# Patient Record
Sex: Female | Born: 1955 | Race: White | Hispanic: No | Marital: Single
Health system: Southern US, Academic
[De-identification: ages and names within clinical notes are randomized; demographics above are authoritative.]

## PROBLEM LIST (undated history)

## (undated) DIAGNOSIS — F32A Depression, unspecified: Secondary | ICD-10-CM

## (undated) DIAGNOSIS — R0600 Dyspnea, unspecified: Secondary | ICD-10-CM

## (undated) DIAGNOSIS — T8859XA Other complications of anesthesia, initial encounter: Secondary | ICD-10-CM

## (undated) DIAGNOSIS — C801 Malignant (primary) neoplasm, unspecified: Secondary | ICD-10-CM

## (undated) DIAGNOSIS — M1990 Unspecified osteoarthritis, unspecified site: Secondary | ICD-10-CM

## (undated) DIAGNOSIS — J449 Chronic obstructive pulmonary disease, unspecified: Secondary | ICD-10-CM

## (undated) DIAGNOSIS — F329 Major depressive disorder, single episode, unspecified: Secondary | ICD-10-CM

## (undated) DIAGNOSIS — T4145XA Adverse effect of unspecified anesthetic, initial encounter: Secondary | ICD-10-CM

## (undated) DIAGNOSIS — I10 Essential (primary) hypertension: Secondary | ICD-10-CM

## (undated) HISTORY — PX: HAND TENDON SURGERY: SHX663

## (undated) HISTORY — PX: DIAGNOSTIC LAPAROSCOPY: SUR761

## (undated) HISTORY — PX: TONSILLECTOMY: SUR1361

## (undated) HISTORY — PX: WRIST FRACTURE SURGERY: SHX121

## (undated) HISTORY — PX: CHOLECYSTECTOMY: SHX55

---

## 2003-07-11 HISTORY — PX: KNEE ARTHROSCOPY: SHX127

## 2008-07-10 HISTORY — PX: ABDOMINAL HYSTERECTOMY: SHX81

## 2010-07-10 HISTORY — PX: SHOULDER OPEN ROTATOR CUFF REPAIR: SHX2407

## 2014-07-10 DIAGNOSIS — C801 Malignant (primary) neoplasm, unspecified: Secondary | ICD-10-CM

## 2014-07-10 HISTORY — DX: Malignant (primary) neoplasm, unspecified: C80.1

## 2015-08-19 ENCOUNTER — Encounter
Admission: RE | Admit: 2015-08-19 | Discharge: 2015-08-19 | Disposition: A | Discharge status: Home / Self Care | Payer: 59 | Source: Ambulatory Visit | Attending: Orthopedic Surgery | Admitting: Orthopedic Surgery

## 2015-08-19 DIAGNOSIS — I10 Essential (primary) hypertension: Secondary | ICD-10-CM | POA: Diagnosis not present

## 2015-08-19 DIAGNOSIS — J449 Chronic obstructive pulmonary disease, unspecified: Secondary | ICD-10-CM | POA: Diagnosis not present

## 2015-08-19 DIAGNOSIS — Z01812 Encounter for preprocedural laboratory examination: Secondary | ICD-10-CM | POA: Diagnosis not present

## 2015-08-19 DIAGNOSIS — M1612 Unilateral primary osteoarthritis, left hip: Secondary | ICD-10-CM | POA: Insufficient documentation

## 2015-08-19 DIAGNOSIS — Z01810 Encounter for preprocedural cardiovascular examination: Secondary | ICD-10-CM | POA: Diagnosis not present

## 2015-08-19 HISTORY — DX: Essential (primary) hypertension: I10

## 2015-08-19 HISTORY — DX: Major depressive disorder, single episode, unspecified: F32.9

## 2015-08-19 HISTORY — DX: Chronic obstructive pulmonary disease, unspecified: J44.9

## 2015-08-19 HISTORY — DX: Other complications of anesthesia, initial encounter: T88.59XA

## 2015-08-19 HISTORY — DX: Unspecified osteoarthritis, unspecified site: M19.90

## 2015-08-19 HISTORY — DX: Depression, unspecified: F32.A

## 2015-08-19 HISTORY — DX: Adverse effect of unspecified anesthetic, initial encounter: T41.45XA

## 2015-08-19 HISTORY — DX: Malignant (primary) neoplasm, unspecified: C80.1

## 2015-08-19 LAB — URINALYSIS COMPLETE WITH MICROSCOPIC (ARMC ONLY)
BACTERIA UA: NONE SEEN
Bilirubin Urine: NEGATIVE
GLUCOSE, UA: NEGATIVE mg/dL
Ketones, ur: NEGATIVE mg/dL
Leukocytes, UA: NEGATIVE
NITRITE: NEGATIVE
PH: 6 (ref 5.0–8.0)
PROTEIN: NEGATIVE mg/dL
Specific Gravity, Urine: 1.011 (ref 1.005–1.030)

## 2015-08-19 LAB — BASIC METABOLIC PANEL
Anion gap: 9 (ref 5–15)
BUN: 16 mg/dL (ref 6–20)
CHLORIDE: 106 mmol/L (ref 101–111)
CO2: 27 mmol/L (ref 22–32)
Calcium: 8.9 mg/dL (ref 8.9–10.3)
Creatinine, Ser: 0.63 mg/dL (ref 0.44–1.00)
GFR calc Af Amer: >60 mL/min (ref >60)
GFR calc non Af Amer: >60 mL/min (ref >60)
GLUCOSE: 100 mg/dL — AB (ref 65–99)
POTASSIUM: 3.7 mmol/L (ref 3.5–5.1)
SODIUM: 142 mmol/L (ref 135–145)

## 2015-08-19 LAB — SEDIMENTATION RATE: SED RATE: 3 mm/h (ref 0–30)

## 2015-08-19 LAB — TYPE AND SCREEN
ABO/RH(D): A POS
Antibody Screen: NEGATIVE

## 2015-08-19 LAB — PROTIME-INR
INR: 1.07
Prothrombin Time: 14.1 seconds (ref 11.4–15.0)

## 2015-08-19 LAB — CBC
HCT: 44.7 % (ref 35.0–47.0)
HEMOGLOBIN: 14.7 g/dL (ref 12.0–16.0)
MCH: 30 pg (ref 26.0–34.0)
MCHC: 32.8 g/dL (ref 32.0–36.0)
MCV: 91.3 fL (ref 80.0–100.0)
Platelets: 231 10*3/uL (ref 150–440)
RBC: 4.9 MIL/uL (ref 3.80–5.20)
RDW: 13.1 % (ref 11.5–14.5)
WBC: 7.4 10*3/uL (ref 3.6–11.0)

## 2015-08-19 LAB — SURGICAL PCR SCREEN
MRSA, PCR: NEGATIVE
Staphylococcus aureus: NEGATIVE

## 2015-08-19 LAB — APTT: APTT: 30 s (ref 24–36)

## 2015-08-19 LAB — ABO/RH: ABO/RH(D): A POS

## 2015-08-19 NOTE — Patient Instructions (Addendum)
  Your procedure is scheduled on: Tuesday Feb. 21, 2016. Report to Same Day Surgery. To find out your arrival time please call [phone] between 1PM - 3PM on Monday Feb. 20, 2017.  Remember: Instructions that are not followed completely may result in serious medical risk, up to and including death, or upon the discretion of your surgeon and anesthesiologist your surgery may need to be rescheduled.    _x___ 1. Do not eat food or drink liquids after midnight. No gum chewing or hard candies.     ____ 2. No Alcohol for 24 hours before or after surgery.   ____ 3. Bring all medications with you on the day of surgery if instructed.    __x__ 4. Notify your doctor if there is any change in your medical condition     (cold, fever, infections).     Do not wear jewelry, make-up, hairpins, clips or nail polish.  Do not wear lotions, powders, or perfumes. You may wear deodorant.  Do not shave 48 hours prior to surgery. Men may shave face and neck.  Do not bring valuables to the hospital.    [facility] is not responsible for any belongings or valuables.               Contacts, dentures or bridgework may not be worn into surgery.  Leave your suitcase in the car. After surgery it may be brought to your room.  For patients admitted to the hospital, discharge time is determined by your treatment team.   Patients discharged the day of surgery will not be allowed to drive home.    Please read over the following fact sheets that you were given:   [facility] Preparing for Surgery  _x___ Take these medicines the morning of surgery with A SIP OF WATER: NONE     ____ Fleet Enema (as directed)   _x__ Use CHG Soap as directed  _x__ Use inhalers on the day of surgery  ____ Stop metformin 2 days prior to surgery    ____ Take 1/2 of usual insulin dose the night before surgery and none on the morning of surgery.   _x__ Stop Coumadin/Plavix/aspirin on Feb. 14, 2017 or before.  _x___ Stop  Anti-inflammatories on on Feb. 14, 2017.  OK to take Tylenol for pain.   ____ Stop supplements until after surgery.    ____ Bring C-Pap to the hospital.

## 2015-08-20 NOTE — Pre-Procedure Instructions (Signed)
Urine results sent to Dr. Rudene Christians and Anesthesia for review.

## 2015-08-21 LAB — URINE CULTURE: SPECIAL REQUESTS: NORMAL

## 2015-08-23 NOTE — Pre-Procedure Instructions (Signed)
URINE CULTURE FAXED TO DR [facility]

## 2015-08-26 NOTE — Pre-Procedure Instructions (Signed)
Spoke with Dr. Rudene Christians nurse Mendel Ryder regarding urine culture results= MULTIPLE SPECIES PRESENT, SUGGEST RECOLLECTION and request faxed to see if Dr. Rudene Christians did want to recollect urine U&C.  Dr. Rudene Christians is waiting to see if pt's insurance will agree to cover the surgery before reordering any further lab tests.

## 2015-08-27 NOTE — Pre-Procedure Instructions (Signed)
Per Dr. Rudene Christians we do not need to repeat urine culture.

## 2015-08-31 ENCOUNTER — Inpatient Hospital Stay: Payer: 59 | Admitting: Certified Registered"

## 2015-08-31 ENCOUNTER — Encounter
Admission: RE | Disposition: A | Discharge status: Skilled Nursing Facility | Payer: Self-pay | Source: Ambulatory Visit | Attending: Orthopedic Surgery

## 2015-08-31 ENCOUNTER — Inpatient Hospital Stay: Admit: 2015-08-31 | Payer: Self-pay | Admitting: Orthopedic Surgery

## 2015-08-31 ENCOUNTER — Inpatient Hospital Stay: Payer: 59

## 2015-08-31 ENCOUNTER — Inpatient Hospital Stay
Admission: RE | Admit: 2015-08-31 | Discharge: 2015-09-03 | DRG: 470 | Disposition: A | Discharge status: Skilled Nursing Facility | Payer: 59 | Source: Ambulatory Visit | Attending: Orthopedic Surgery | Admitting: Orthopedic Surgery

## 2015-08-31 DIAGNOSIS — G2581 Restless legs syndrome: Secondary | ICD-10-CM | POA: Diagnosis present

## 2015-08-31 DIAGNOSIS — Z79891 Long term (current) use of opiate analgesic: Secondary | ICD-10-CM

## 2015-08-31 DIAGNOSIS — Z79899 Other long term (current) drug therapy: Secondary | ICD-10-CM

## 2015-08-31 DIAGNOSIS — F329 Major depressive disorder, single episode, unspecified: Secondary | ICD-10-CM | POA: Diagnosis present

## 2015-08-31 DIAGNOSIS — J449 Chronic obstructive pulmonary disease, unspecified: Secondary | ICD-10-CM | POA: Diagnosis present

## 2015-08-31 DIAGNOSIS — I10 Essential (primary) hypertension: Secondary | ICD-10-CM | POA: Diagnosis present

## 2015-08-31 DIAGNOSIS — M1612 Unilateral primary osteoarthritis, left hip: Principal | ICD-10-CM | POA: Diagnosis present

## 2015-08-31 DIAGNOSIS — G8918 Other acute postprocedural pain: Secondary | ICD-10-CM

## 2015-08-31 DIAGNOSIS — Z7951 Long term (current) use of inhaled steroids: Secondary | ICD-10-CM

## 2015-08-31 DIAGNOSIS — Z7982 Long term (current) use of aspirin: Secondary | ICD-10-CM

## 2015-08-31 DIAGNOSIS — Z419 Encounter for procedure for purposes other than remedying health state, unspecified: Secondary | ICD-10-CM

## 2015-08-31 DIAGNOSIS — Z85828 Personal history of other malignant neoplasm of skin: Secondary | ICD-10-CM

## 2015-08-31 HISTORY — PX: TOTAL HIP ARTHROPLASTY: SHX124

## 2015-08-31 LAB — CREATININE, SERUM
Creatinine, Ser: 0.62 mg/dL (ref 0.44–1.00)
GFR calc non Af Amer: >60 mL/min (ref >60)

## 2015-08-31 LAB — CBC
HEMATOCRIT: 42.4 % (ref 35.0–47.0)
HEMOGLOBIN: 13.8 g/dL (ref 12.0–16.0)
MCH: 29.8 pg (ref 26.0–34.0)
MCHC: 32.6 g/dL (ref 32.0–36.0)
MCV: 91.6 fL (ref 80.0–100.0)
Platelets: 218 10*3/uL (ref 150–440)
RBC: 4.62 MIL/uL (ref 3.80–5.20)
RDW: 13.1 % (ref 11.5–14.5)
WBC: 12.4 10*3/uL — AB (ref 3.6–11.0)

## 2015-08-31 SURGERY — ARTHROPLASTY, HIP, TOTAL, ANTERIOR APPROACH
Anesthesia: Choice | Laterality: Left

## 2015-08-31 SURGERY — ARTHROPLASTY, HIP, TOTAL, ANTERIOR APPROACH
Anesthesia: General | Laterality: Left | Wound class: Clean

## 2015-08-31 MED ORDER — ONDANSETRON HCL 4 MG/2ML IJ SOLN
INTRAMUSCULAR | Status: DC | PRN
  Administered 2015-08-31: 4 mg via INTRAVENOUS

## 2015-08-31 MED ORDER — BUPIVACAINE-EPINEPHRINE 0.25% -1:200000 IJ SOLN
INTRAMUSCULAR | Status: DC | PRN
  Administered 2015-08-31: 30 mL

## 2015-08-31 MED ORDER — MIDAZOLAM HCL 2 MG/2ML IJ SOLN
INTRAMUSCULAR | Status: DC | PRN
  Administered 2015-08-31: 2 mg via INTRAVENOUS

## 2015-08-31 MED ORDER — ACETAMINOPHEN 325 MG PO TABS
650.0000 mg | ORAL_TABLET | Freq: Four times a day (QID) | ORAL | Status: DC | PRN
  Administered 2015-09-01: 650 mg via ORAL

## 2015-08-31 MED ORDER — IPRATROPIUM-ALBUTEROL 0.5-2.5 (3) MG/3ML IN SOLN
RESPIRATORY_TRACT | Status: AC
  Administered 2015-08-31: 3 mL via RESPIRATORY_TRACT
  Filled 2015-08-31: qty 3

## 2015-08-31 MED ORDER — NEOMYCIN-POLYMYXIN B GU 40-200000 IR SOLN
Status: DC | PRN
  Administered 2015-08-31: 4 mL

## 2015-08-31 MED ORDER — ENOXAPARIN SODIUM 40 MG/0.4ML ~~LOC~~ SOLN
40.0000 mg | SUBCUTANEOUS | Status: DC
  Administered 2015-09-01 – 2015-09-03 (×3): 40 mg via SUBCUTANEOUS
  Filled 2015-08-31 (×2): qty 0.4

## 2015-08-31 MED ORDER — SODIUM CHLORIDE 0.9 % IV SOLN
INTRAVENOUS | Status: DC
  Administered 2015-08-31 – 2015-09-01 (×2): via INTRAVENOUS

## 2015-08-31 MED ORDER — HYDROCHLOROTHIAZIDE 12.5 MG PO CAPS
12.5000 mg | ORAL_CAPSULE | ORAL | Status: DC
  Administered 2015-09-01 – 2015-09-03 (×3): 12.5 mg via ORAL
  Filled 2015-08-31 (×3): qty 1

## 2015-08-31 MED ORDER — ROCURONIUM BROMIDE 100 MG/10ML IV SOLN
INTRAVENOUS | Status: DC | PRN
  Administered 2015-08-31: 40 mg via INTRAVENOUS
  Administered 2015-08-31: 10 mg via INTRAVENOUS

## 2015-08-31 MED ORDER — CEFAZOLIN SODIUM-DEXTROSE 2-3 GM-% IV SOLR
2.0000 g | Freq: Four times a day (QID) | INTRAVENOUS | Status: AC
  Administered 2015-08-31 – 2015-09-01 (×3): 2 g via INTRAVENOUS
  Filled 2015-08-31 (×4): qty 50

## 2015-08-31 MED ORDER — LACTATED RINGERS IV SOLN
INTRAVENOUS | Status: DC
  Administered 2015-08-31 (×2): via INTRAVENOUS

## 2015-08-31 MED ORDER — ONDANSETRON HCL 4 MG/2ML IJ SOLN
4.0000 mg | Freq: Four times a day (QID) | INTRAMUSCULAR | Status: DC | PRN
  Administered 2015-09-01: 4 mg via INTRAVENOUS
  Filled 2015-08-31: qty 2

## 2015-08-31 MED ORDER — ROPINIROLE HCL 1 MG PO TABS
1.0000 mg | ORAL_TABLET | Freq: Three times a day (TID) | ORAL | Status: DC | PRN
  Administered 2015-08-31 – 2015-09-02 (×4): 1 mg via ORAL
  Filled 2015-08-31 (×4): qty 1

## 2015-08-31 MED ORDER — FAMOTIDINE 20 MG PO TABS
ORAL_TABLET | ORAL | Status: AC
  Administered 2015-08-31: 20 mg via ORAL
  Filled 2015-08-31: qty 1

## 2015-08-31 MED ORDER — CEFAZOLIN SODIUM-DEXTROSE 2-3 GM-% IV SOLR: 2.0000 g | Freq: Once | INTRAVENOUS | Status: DC

## 2015-08-31 MED ORDER — PHENOL 1.4 % MT LIQD
1.0000 | OROMUCOSAL | Status: DC | PRN
Start: 2015-08-31 — End: 2015-09-03
  Filled 2015-08-31: qty 177

## 2015-08-31 MED ORDER — ACETAMINOPHEN 10 MG/ML IV SOLN
INTRAVENOUS | Status: DC | PRN
  Administered 2015-08-31: 1000 mg via INTRAVENOUS

## 2015-08-31 MED ORDER — FENTANYL CITRATE (PF) 100 MCG/2ML IJ SOLN: 25.0000 ug | INTRAMUSCULAR | Status: DC | PRN

## 2015-08-31 MED ORDER — ALBUTEROL SULFATE (2.5 MG/3ML) 0.083% IN NEBU: 2.5000 mg | INHALATION_SOLUTION | RESPIRATORY_TRACT | Status: DC | PRN

## 2015-08-31 MED ORDER — HYDROMORPHONE HCL 1 MG/ML IJ SOLN
INTRAMUSCULAR | Status: AC
  Filled 2015-08-31: qty 1

## 2015-08-31 MED ORDER — ONDANSETRON HCL 4 MG PO TABS: 4.0000 mg | ORAL_TABLET | Freq: Four times a day (QID) | ORAL | Status: DC | PRN

## 2015-08-31 MED ORDER — EPHEDRINE SULFATE 50 MG/ML IJ SOLN
INTRAMUSCULAR | Status: DC | PRN
  Administered 2015-08-31: 10 mg via INTRAVENOUS
  Administered 2015-08-31: 5 mg via INTRAVENOUS
  Administered 2015-08-31: 10 mg via INTRAVENOUS

## 2015-08-31 MED ORDER — NEOMYCIN-POLYMYXIN B GU 40-200000 IR SOLN
Status: AC
  Filled 2015-08-31: qty 4

## 2015-08-31 MED ORDER — ACETAMINOPHEN 650 MG RE SUPP: 650.0000 mg | Freq: Four times a day (QID) | RECTAL | Status: DC | PRN

## 2015-08-31 MED ORDER — PHENYLEPHRINE HCL 10 MG/ML IJ SOLN
INTRAMUSCULAR | Status: DC | PRN
  Administered 2015-08-31 (×5): 100 ug via INTRAVENOUS

## 2015-08-31 MED ORDER — TRANEXAMIC ACID 1000 MG/10ML IV SOLN
1000.0000 mg | INTRAVENOUS | Status: DC
  Filled 2015-08-31: qty 10

## 2015-08-31 MED ORDER — CEFAZOLIN SODIUM-DEXTROSE 2-3 GM-% IV SOLR
INTRAVENOUS | Status: AC
  Administered 2015-08-31: 2 g via INTRAVENOUS
  Filled 2015-08-31: qty 50

## 2015-08-31 MED ORDER — ONDANSETRON HCL 4 MG/2ML IJ SOLN: 4.0000 mg | Freq: Once | INTRAMUSCULAR | Status: DC | PRN

## 2015-08-31 MED ORDER — ASPIRIN 81 MG PO CHEW
81.0000 mg | CHEWABLE_TABLET | Freq: Every day | ORAL | Status: DC
  Administered 2015-08-31 – 2015-09-02 (×3): 81 mg via ORAL
  Filled 2015-08-31 (×3): qty 1

## 2015-08-31 MED ORDER — OXYCODONE HCL 5 MG PO TABS
5.0000 mg | ORAL_TABLET | ORAL | Status: DC | PRN
  Administered 2015-08-31 – 2015-09-01 (×5): 5 mg via ORAL
  Filled 2015-08-31 (×5): qty 1

## 2015-08-31 MED ORDER — ACETAMINOPHEN 10 MG/ML IV SOLN
INTRAVENOUS | Status: AC
  Filled 2015-08-31: qty 100

## 2015-08-31 MED ORDER — MENTHOL 3 MG MT LOZG
1.0000 | LOZENGE | OROMUCOSAL | Status: DC | PRN
  Filled 2015-08-31: qty 9

## 2015-08-31 MED ORDER — ALUM & MAG HYDROXIDE-SIMETH 200-200-20 MG/5ML PO SUSP: 30.0000 mL | ORAL | Status: DC | PRN

## 2015-08-31 MED ORDER — TRANEXAMIC ACID 1000 MG/10ML IV SOLN
1000.0000 mg | INTRAVENOUS | Status: DC | PRN
  Administered 2015-08-31: 1000 mg via INTRAVENOUS

## 2015-08-31 MED ORDER — INFLUENZA VAC SPLIT QUAD 0.5 ML IM SUSY
0.5000 mL | PREFILLED_SYRINGE | INTRAMUSCULAR | Status: DC
  Filled 2015-08-31 (×2): qty 0.5

## 2015-08-31 MED ORDER — FENTANYL CITRATE (PF) 100 MCG/2ML IJ SOLN
INTRAMUSCULAR | Status: DC | PRN
  Administered 2015-08-31: 50 ug via INTRAVENOUS
  Administered 2015-08-31: 100 ug via INTRAVENOUS
  Administered 2015-08-31 (×2): 50 ug via INTRAVENOUS

## 2015-08-31 MED ORDER — FAMOTIDINE 20 MG PO TABS
20.0000 mg | ORAL_TABLET | Freq: Once | ORAL | Status: AC
  Administered 2015-08-31: 20 mg via ORAL

## 2015-08-31 MED ORDER — HYDROMORPHONE HCL 1 MG/ML IJ SOLN
INTRAMUSCULAR | Status: DC | PRN
  Administered 2015-08-31: 1 mg via INTRAVENOUS
  Administered 2015-08-31: 0.5 mg via INTRAVENOUS

## 2015-08-31 MED ORDER — ALBUTEROL SULFATE HFA 108 (90 BASE) MCG/ACT IN AERS
INHALATION_SPRAY | RESPIRATORY_TRACT | Status: DC | PRN
  Administered 2015-08-31 (×2): 2 via RESPIRATORY_TRACT

## 2015-08-31 MED ORDER — PROPOFOL 10 MG/ML IV BOLUS
INTRAVENOUS | Status: DC | PRN
  Administered 2015-08-31: 200 mg via INTRAVENOUS

## 2015-08-31 MED ORDER — METOCLOPRAMIDE HCL 5 MG PO TABS: 5.0000 mg | ORAL_TABLET | Freq: Three times a day (TID) | ORAL | Status: DC | PRN

## 2015-08-31 MED ORDER — TRANEXAMIC ACID 1000 MG/10ML IV SOLN
1000.0000 mg | Freq: Once | INTRAVENOUS | Status: AC
  Administered 2015-08-31: 1000 mg via INTRAVENOUS
  Filled 2015-08-31: qty 10

## 2015-08-31 MED ORDER — IPRATROPIUM-ALBUTEROL 0.5-2.5 (3) MG/3ML IN SOLN: 3.0000 mL | Freq: Four times a day (QID) | RESPIRATORY_TRACT | Status: DC

## 2015-08-31 MED ORDER — LIDOCAINE HCL (CARDIAC) 20 MG/ML IV SOLN
INTRAVENOUS | Status: DC | PRN
  Administered 2015-08-31: 50 mg via INTRAVENOUS

## 2015-08-31 MED ORDER — GLYCOPYRROLATE 0.2 MG/ML IJ SOLN
INTRAMUSCULAR | Status: DC | PRN
  Administered 2015-08-31: 0.2 mg via INTRAVENOUS

## 2015-08-31 MED ORDER — KETAMINE HCL 10 MG/ML IJ SOLN
INTRAMUSCULAR | Status: DC | PRN
  Administered 2015-08-31: 20 mg via INTRAVENOUS
  Administered 2015-08-31: 30 mg via INTRAVENOUS

## 2015-08-31 MED ORDER — METOCLOPRAMIDE HCL 5 MG/ML IJ SOLN: 5.0000 mg | Freq: Three times a day (TID) | INTRAMUSCULAR | Status: DC | PRN

## 2015-08-31 MED ORDER — MORPHINE SULFATE (PF) 2 MG/ML IV SOLN: 2.0000 mg | INTRAVENOUS | Status: DC | PRN

## 2015-08-31 MED ORDER — SERTRALINE HCL 100 MG PO TABS
150.0000 mg | ORAL_TABLET | Freq: Every day | ORAL | Status: DC
  Administered 2015-08-31 – 2015-09-02 (×3): 150 mg via ORAL
  Filled 2015-08-31 (×3): qty 2

## 2015-08-31 MED ORDER — IPRATROPIUM-ALBUTEROL 0.5-2.5 (3) MG/3ML IN SOLN
3.0000 mL | Freq: Once | RESPIRATORY_TRACT | Status: AC
  Administered 2015-08-31: 3 mL via RESPIRATORY_TRACT

## 2015-08-31 MED ORDER — BUPIVACAINE-EPINEPHRINE (PF) 0.25% -1:200000 IJ SOLN
INTRAMUSCULAR | Status: AC
  Filled 2015-08-31: qty 30

## 2015-08-31 MED ORDER — TIOTROPIUM BROMIDE MONOHYDRATE 18 MCG IN CAPS
18.0000 ug | ORAL_CAPSULE | Freq: Every day | RESPIRATORY_TRACT | Status: DC
Start: 2015-08-31 — End: 2015-09-03
  Administered 2015-09-01 – 2015-09-03 (×3): 18 ug via RESPIRATORY_TRACT
  Filled 2015-08-31: qty 5

## 2015-08-31 SURGICAL SUPPLY — 46 items
BLADE SAW SAG 18.5X105 (BLADE) ×3 IMPLANT
BNDG COHESIVE 6X5 TAN STRL LF (GAUZE/BANDAGES/DRESSINGS) ×6 IMPLANT
CANISTER SUCT 1200ML W/VALVE (MISCELLANEOUS) ×3 IMPLANT
CAPT HIP TOTAL 3 ×3 IMPLANT
CATH FOL LEG HOLDER (MISCELLANEOUS) ×3 IMPLANT
CATH TRAY METER 16FR LF (MISCELLANEOUS) ×3 IMPLANT
CHLORAPREP W/TINT 26ML (MISCELLANEOUS) ×3 IMPLANT
DRAPE C-ARM XRAY 36X54 (DRAPES) ×3 IMPLANT
DRAPE C-SECTION (MISCELLANEOUS) IMPLANT
DRAPE INCISE IOBAN 66X60 STRL (DRAPES) IMPLANT
DRAPE POUCH INSTRU U-SHP 10X18 (DRAPES) ×3 IMPLANT
DRAPE SHEET LG 3/4 BI-LAMINATE (DRAPES) ×9 IMPLANT
DRAPE STERI IOBAN 125X83 (DRAPES) ×3 IMPLANT
DRAPE TABLE BACK 80X90 (DRAPES) ×3 IMPLANT
DRSG OPSITE POSTOP 4X8 (GAUZE/BANDAGES/DRESSINGS) ×6 IMPLANT
ELECT BLADE 6.5 EXT (BLADE) ×3 IMPLANT
GAUZE SPONGE 4X4 12PLY STRL (GAUZE/BANDAGES/DRESSINGS) IMPLANT
GLOVE BIOGEL PI IND STRL 9 (GLOVE) ×1 IMPLANT
GLOVE BIOGEL PI INDICATOR 9 (GLOVE) ×2
GLOVE SURG ORTHO 9.0 STRL STRW (GLOVE) ×3 IMPLANT
GOWN SPECIALTY ULTRA XL (MISCELLANEOUS) ×3 IMPLANT
GOWN STRL REUS W/ TWL LRG LVL3 (GOWN DISPOSABLE) ×1 IMPLANT
GOWN STRL REUS W/TWL LRG LVL3 (GOWN DISPOSABLE) ×2
HEMOVAC 400CC 10FR (MISCELLANEOUS) IMPLANT
HOOD PEEL AWAY FLYTE STAYCOOL (MISCELLANEOUS) ×6 IMPLANT
MAT BLUE FLOOR 46X72 FLO (MISCELLANEOUS) ×3 IMPLANT
NDL SAFETY 18GX1.5 (NEEDLE) IMPLANT
NEEDLE SPNL 18GX3.5 QUINCKE PK (NEEDLE) ×3 IMPLANT
NS IRRIG 1000ML POUR BTL (IV SOLUTION) ×3 IMPLANT
PACK HIP COMPR (MISCELLANEOUS) ×3 IMPLANT
SOL PREP PVP 2OZ (MISCELLANEOUS) ×3
SOLUTION PREP PVP 2OZ (MISCELLANEOUS) ×1 IMPLANT
STAPLER SKIN PROX 35W (STAPLE) ×3 IMPLANT
STRAP SAFETY BODY (MISCELLANEOUS) ×3 IMPLANT
SUT DVC 2 QUILL PDO  T11 36X36 (SUTURE) ×2
SUT DVC 2 QUILL PDO T11 36X36 (SUTURE) ×1 IMPLANT
SUT DVC QUILL MONODERM 30X30 (SUTURE) ×3 IMPLANT
SUT ETHIBOND NAB CT1 #1 30IN (SUTURE) ×3 IMPLANT
SUT SILK 0 (SUTURE) ×2
SUT SILK 0 30XBRD TIE 6 (SUTURE) ×1 IMPLANT
SUT VIC AB 1 CT1 36 (SUTURE) ×3 IMPLANT
SYR 20CC LL (SYRINGE) IMPLANT
SYR 30ML LL (SYRINGE) ×3 IMPLANT
TAPE MICROFOAM 4IN (TAPE) IMPLANT
TUBE KAMVAC SUCTION (TUBING) ×3 IMPLANT
WATER STERILE IRR 1000ML POUR (IV SOLUTION) ×3 IMPLANT

## 2015-08-31 NOTE — NC FL2 (Signed)
[place] LEVEL OF CARE SCREENING TOOL     IDENTIFICATION  Patient Name: Melanie Reilly Birthdate: Mar 30, 1956 Sex: female Admission Date (Current Location): 08/31/2015  [place] and [place] Number:  [employer] and Address:  [facility], [address] [place], [place] [address]      Provider Number: [id number]  Attending Physician Name and Address:  Hessie Knows, MD  Relative Name and Phone Number:       Current Level of Care: Hospital Recommended Level of Care: [place] Prior Approval Number:    Date Approved/Denied:   PASRR Number:  ([id number])  Discharge Plan: SNF    Current Diagnoses: Patient Active Problem List   Diagnosis Date Noted  . Primary osteoarthritis of left hip 08/31/2015   COPD (chronic obstructive pulmonary disease) , unspecified (CMS-HCC)    Skin cancer    Hypertension    Depression, unspecified    Restless legs       Orientation RESPIRATION BLADDER Height & Weight     Self, Time, Situation, Place  O2 (2.5 Liters Oxygen ) Continent Weight: 217 lb 4.8 oz (98.567 kg) Height:  5\' 10"  (177.8 cm)  BEHAVIORAL SYMPTOMS/MOOD NEUROLOGICAL BOWEL NUTRITION STATUS   (none )  (none ) Continent Diet (Diet: Clear Liquid )  AMBULATORY STATUS COMMUNICATION OF NEEDS Skin   Extensive Assist Verbally Surgical wounds (Incision: Left Hip. )                       Personal Care Assistance Level of Assistance  Bathing, Feeding, Dressing Bathing Assistance: Limited assistance Feeding assistance: Independent Dressing Assistance: Limited assistance     Functional Limitations Info  Sight, Hearing, Speech Sight Info: Adequate Hearing Info: Adequate Speech Info: Adequate    SPECIAL CARE FACTORS FREQUENCY  PT (By licensed PT), OT (By licensed OT)     PT Frequency:  (5) OT Frequency:  (5)            Contractures      Additional Factors Info  Code Status Code Status Info:   (Full Code. )             Current Medications (08/31/2015):  This is the current hospital active medication list Current Facility-Administered Medications  Medication Dose Route Frequency Provider Last Rate Last Dose  . 0.9 %  sodium chloride infusion   Intravenous Continuous Hessie Knows, MD 75 mL/hr at 08/31/15 1507    . acetaminophen (TYLENOL) tablet 650 mg  650 mg Oral Q6H PRN Hessie Knows, MD       Or  . acetaminophen (TYLENOL) suppository 650 mg  650 mg Rectal Q6H PRN Hessie Knows, MD      . albuterol (PROVENTIL) (2.5 MG/3ML) 0.083% nebulizer solution 2.5 mg  2.5 mg Inhalation Q4H PRN Hessie Knows, MD      . alum & mag hydroxide-simeth (MAALOX/MYLANTA) 200-200-20 MG/5ML suspension 30 mL  30 mL Oral Q4H PRN Hessie Knows, MD      . aspirin chewable tablet 81 mg  81 mg Oral QHS Hessie Knows, MD      . ceFAZolin (ANCEF) IVPB 2 g/50 mL premix  2 g Intravenous Q6H Hessie Knows, MD      . Derrill Memo ON 09/01/2015] enoxaparin (LOVENOX) injection 40 mg  40 mg Subcutaneous Q24H Hessie Knows, MD      . Derrill Memo ON 09/01/2015] hydrochlorothiazide (MICROZIDE) capsule 12.5 mg  12.5 mg Oral Tedra Senegal, MD      . [  START ON 09/01/2015] Influenza vac split quadrivalent PF (FLUARIX) injection 0.5 mL  0.5 mL Intramuscular Tomorrow-1000 Hessie Knows, MD      . menthol-cetylpyridinium (CEPACOL) lozenge 3 mg  1 lozenge Oral PRN Hessie Knows, MD       Or  . phenol (CHLORASEPTIC) mouth spray 1 spray  1 spray Mouth/Throat PRN Hessie Knows, MD      . metoCLOPramide (REGLAN) tablet 5-10 mg  5-10 mg Oral Q8H PRN Hessie Knows, MD       Or  . metoCLOPramide (REGLAN) injection 5-10 mg  5-10 mg Intravenous Q8H PRN Hessie Knows, MD      . morphine 2 MG/ML injection 2 mg  2 mg Intravenous Q1H PRN Hessie Knows, MD      . ondansetron [facility]) tablet 4 mg  4 mg Oral Q6H PRN Hessie Knows, MD       Or  . ondansetron [facility]) injection 4 mg  4 mg Intravenous Q6H PRN Hessie Knows, MD      . oxyCODONE (Oxy IR/ROXICODONE)  immediate release tablet 5-10 mg  5-10 mg Oral Q3H PRN Hessie Knows, MD      . rOPINIRole (REQUIP) tablet 1 mg  1 mg Oral TID PRN Hessie Knows, MD      . sertraline (ZOLOFT) tablet 150 mg  150 mg Oral QHS Hessie Knows, MD      . tiotropium [facility]) inhalation capsule 18 mcg  18 mcg Inhalation Daily Hessie Knows, MD   18 mcg at 08/31/15 1523     Discharge Medications: Please see discharge summary for a list of discharge medications.  Relevant Imaging Results:  Relevant Lab Results:   Additional Information  (SSN: [id number])  Loralyn Freshwater, LCSW

## 2015-08-31 NOTE — OR Nursing (Signed)
Sacral pad sent to OR 

## 2015-08-31 NOTE — Care Management Note (Addendum)
Case Management Note  Patient Details  Name: Tayllor Kindle MRN: [id number] Date of Birth: Jul 04, 1956  Subjective/Objective:     Mrs Muffley will be going home with her sister after this hospital discharge to the following address: [address], [place], [place],  Ph: [phone],  Sister Gavin Pound.   59yo Mrs Castella Postlethwaite received a left THA on 08/31/15 by Dr Rudene Christians. Hx: COPD. PCP=DR John Doughton. Pharmacy=[facility] in [place], [place]. Lives in an appartment with 2 friends in [place], but will be staying with her sister after surgery at the address in [place] listed above. Has no home assistive equipment. A rolling walker was delivered to her hospital room today by [facility]. Daughter refused offer of a BSC. No home oxygen and no current home health services. Daughter will provide transportation to appointments. Lovenox 40mg  SQ daily x 14 days called to [facility] in [place], [place].ph: [phone].  Case management will follow for discharge planning.             Action/Plan:   Expected Discharge Date:                  Expected Discharge Plan:     In-House Referral:     Discharge planning Services     Post Acute Care Choice:    Choice offered to:     DME Arranged:    DME Agency:     HH Arranged:    [place] Agency:     Status of Service:     Medicare Important Message Given:    Date Medicare IM Given:    Medicare IM give by:    Date Additional Medicare IM Given:    Additional Medicare Important Message give by:     If discussed at [place] of Stay Meetings, dates discussed:    Additional Comments:  Laila Myhre A, RN 08/31/2015, 3:16 PM

## 2015-08-31 NOTE — Op Note (Signed)
08/31/2015  1:01 PM  PATIENT:  Melanie Reilly  60 y.o. female  PRE-OPERATIVE DIAGNOSIS: Primary hip osteoarthritis  POST-OPERATIVE DIAGNOSIS:  Primary hip osteoarthritis left  PROCEDURE:  Procedure(s): TOTAL HIP ARTHROPLASTY ANTERIOR APPROACH (Left)  SURGEON: Laurene Footman, MD  ASSISTANTS: None  ANESTHESIA:   general  EBL:  Total I/O In: 1000 [I.V.:1000] Out: 350 [Urine:150; Blood:200]  BLOOD ADMINISTERED:none  DRAINS: none   LOCAL MEDICATIONS USED:  BUPIVICAINE   SPECIMEN:  Source of Specimen:  Left femoral head  DISPOSITION OF SPECIMEN:  PATHOLOGY  COUNTS:  YES  TOURNIQUET:  * No tourniquets in log *  IMPLANTS: Medacta AMIS 3 standard stem, 52 mm Mpact cup DM with M 28 mm head and liner  DICTATION: .Dragon Dictation   The patient was brought to the operating room and after general anesthesia was obtained patient was placed on the operative table with the ipsilateral foot into the Medacta attachment, contralateral leg on a well-padded table. C-arm was brought in and preop template x-ray taken. After prepping and draping in usual sterile fashion appropriate patient identification and timeout procedures were completed. Anterior approach to the hip was obtained and centered over the greater trochanter and TFL muscle. The subcutaneous tissue was incised hemostasis being achieved by electrocautery. TFL fascia was incised and the muscle retracted laterally deep retractor placed. The lateral femoral circumflex vessels were identified and ligated. The anterior capsule was exposed and a capsulotomy performed. The neck was identified and a femoral neck cut carried out with a saw. The head was removed without difficulty and showed sclerotic femoral head and acetabulum. Reaming was carried out to 52 mm and a 52 mm cup trial gave appropriate tightness to the acetabular component a 77 Mpact DM cup was impacted into position. The leg was then externally rotated and ischiofemoral and  patellofemoral releases carried out. The femur was sequentially broached to a size 3, size 3 standard stem and S head trials were placed, followed by M head which gave better leg length and offset and the final components chosen. The 3 standard collared stem was inserted along with a M 28 mm head and 52 mm liner. The hip was reduced and was stable the wound was thoroughly irrigated with a dilute Betadine solution. The deep fascia view. Using a heavy Quill after infiltration of 30 cc of quarter percent Sensorcaine with epinephrine. 2-0 Quill to close the skin with skin staples Xeroform and honeycomb dressing applied  PLAN OF CARE: Admit to inpatient

## 2015-08-31 NOTE — Addendum Note (Signed)
Addendum  created 08/31/15 1336 by Rolla Plate, CRNA   Modules edited: Charges VN

## 2015-08-31 NOTE — Transfer of Care (Signed)
Immediate Anesthesia Transfer of Care Note  Patient: Melanie Reilly  Procedure(s) Performed: Procedure(s): TOTAL HIP ARTHROPLASTY ANTERIOR APPROACH (Left)  Patient Location: PACU  Anesthesia Type:General  Level of Consciousness: awake  Airway & Oxygen Therapy: Patient Spontanous Breathing and Patient connected to face mask oxygen  Post-op Assessment: Report given to RN  Post vital signs: Reviewed  Last Vitals:  Filed Vitals:   08/31/15 1009 08/31/15 1300  BP: 181/78 96/50  Pulse: 68 76  Temp: 36.4 C 37.3 C  Resp: 16 13    Complications: No apparent anesthesia complications

## 2015-08-31 NOTE — Anesthesia Procedure Notes (Signed)
Procedure Name: Intubation Performed by: Rolla Plate Pre-anesthesia Checklist: Patient identified, Patient being monitored, Timeout performed, Emergency Drugs available and Suction available Patient Re-evaluated:Patient Re-evaluated prior to inductionOxygen Delivery Method: Circle system utilized Preoxygenation: Pre-oxygenation with 100% oxygen Intubation Type: IV induction Ventilation: Mask ventilation without difficulty Laryngoscope Size: Miller and 2 Grade View: Grade I Tube type: Oral Tube size: 7.0 mm Number of attempts: 1 Placement Confirmation: ETT inserted through vocal cords under direct vision,  positive ETCO2 and breath sounds checked- equal and bilateral Secured at: 21 cm Tube secured with: Tape Dental Injury: Teeth and Oropharynx as per pre-operative assessment

## 2015-08-31 NOTE — H&P (Signed)
Reviewed paper H+P, will be scanned into chart. No changes noted.  

## 2015-08-31 NOTE — Anesthesia Preprocedure Evaluation (Signed)
Anesthesia Evaluation  Patient identified by MRN, date of birth, ID band Patient awake    Reviewed: Allergy & Precautions, H&P , NPO status , Patient's Chart, lab work & pertinent test results, reviewed documented beta blocker date and time   History of Anesthesia Complications (+) history of anesthetic complications  Airway Mallampati: II   Neck ROM: full    Dental  (+) Poor Dentition   Pulmonary neg pulmonary ROS, COPD,  COPD inhaler, Current Smoker,    Pulmonary exam normal        Cardiovascular hypertension, negative cardio ROS Normal cardiovascular exam     Neuro/Psych negative neurological ROS  negative psych ROS   GI/Hepatic negative GI ROS, Neg liver ROS,   Endo/Other  negative endocrine ROS  Renal/GU negative Renal ROS  negative genitourinary   Musculoskeletal   Abdominal   Peds  Hematology negative hematology ROS (+)   Anesthesia Other Findings Past Medical History:   Complication of anesthesia                                     Comment:my COPD, I had difficulty breathing when I woke              up.   COPD (chronic obstructive pulmonary disease) (*              Hypertension                                                 Depression                                                   Arthritis                                                    Cancer ([place])                                    2016           Comment:skin cancer on forehead Past Surgical History:   CHOLECYSTECTOMY                                               SHOULDER OPEN ROTATOR CUFF REPAIR               Left 2012         KNEE ARTHROSCOPY                                Left 2005         ABDOMINAL HYSTERECTOMY  2010         TONSILLECTOMY                                                 DIAGNOSTIC LAPAROSCOPY                                      BMI    Body Mass Index   28.83 kg/m 2     Reproductive/Obstetrics                             Anesthesia Physical Anesthesia Plan  ASA: III  Anesthesia Plan: General   Post-op Pain Management:    Induction:   Airway Management Planned:   Additional Equipment:   Intra-op Plan:   Post-operative Plan:   Informed Consent: I have reviewed the patients History and Physical, chart, labs and discussed the procedure including the risks, benefits and alternatives for the proposed anesthesia with the patient or authorized representative who has indicated his/her understanding and acceptance.   Dental Advisory Given  Plan Discussed with: CRNA  Anesthesia Plan Comments:         Anesthesia Quick Evaluation

## 2015-08-31 NOTE — Anesthesia Postprocedure Evaluation (Signed)
Anesthesia Post Note  Patient: Melanie Reilly  Procedure(s) Performed: Procedure(s) (LRB): TOTAL HIP ARTHROPLASTY ANTERIOR APPROACH (Left)  Patient location during evaluation: PACU Anesthesia Type: General Level of consciousness: awake and alert Pain management: pain level controlled Vital Signs Assessment: post-procedure vital signs reviewed and stable Respiratory status: spontaneous breathing, nonlabored ventilation, respiratory function stable and patient connected to nasal cannula oxygen Cardiovascular status: blood pressure returned to baseline and stable Postop Assessment: no signs of nausea or vomiting Anesthetic complications: no    Last Vitals:  Filed Vitals:   08/31/15 1300 08/31/15 1315  BP: 96/50 109/58  Pulse: 76 80  Temp: 37.3 C   Resp: 13 12    Last Pain:  Filed Vitals:   08/31/15 1317  PainSc: [place] Carletta Feasel

## 2015-08-31 NOTE — Progress Notes (Signed)
PT Cancellation Note  Patient Details Name: Melanie Reilly MRN: [id number] DOB: [birth year]   Cancelled Treatment:    Reason Eval/Treat Not Completed: Other (comment). Pt very lethargic upon arrival. Unable to keep eyes open. Pt not able to participate in supine there-ex or mobility at this time secondary to lethargy. Son in room and in agreement that therapy will resume next date.   Norvin Ohlin 08/31/2015, 3:30 PM  Greggory Stallion, PT, DPT [phone]

## 2015-09-01 MED ORDER — BISACODYL 5 MG PO TBEC
5.0000 mg | DELAYED_RELEASE_TABLET | Freq: Every day | ORAL | Status: DC | PRN
  Administered 2015-09-02: 5 mg via ORAL
  Filled 2015-09-01: qty 1

## 2015-09-01 MED ORDER — DOCUSATE SODIUM 100 MG PO CAPS
100.0000 mg | ORAL_CAPSULE | Freq: Two times a day (BID) | ORAL | Status: DC
  Administered 2015-09-01 – 2015-09-03 (×4): 100 mg via ORAL
  Filled 2015-09-01 (×4): qty 1

## 2015-09-01 MED ORDER — NICOTINE 14 MG/24HR TD PT24
14.0000 mg | MEDICATED_PATCH | Freq: Every day | TRANSDERMAL | Status: DC
  Administered 2015-09-01 – 2015-09-03 (×3): 14 mg via TRANSDERMAL
  Filled 2015-09-01 (×3): qty 1

## 2015-09-01 MED ORDER — MAGNESIUM HYDROXIDE 400 MG/5ML PO SUSP
30.0000 mL | Freq: Every day | ORAL | Status: DC | PRN
  Administered 2015-09-01: 30 mL via ORAL
  Filled 2015-09-01: qty 30

## 2015-09-01 MED ORDER — HYDROCODONE-ACETAMINOPHEN 5-325 MG PO TABS
1.0000 | ORAL_TABLET | ORAL | Status: DC | PRN
  Administered 2015-09-01 – 2015-09-03 (×6): 1 via ORAL
  Filled 2015-09-01 (×6): qty 1

## 2015-09-01 NOTE — Care Management Note (Signed)
Case Management Note  Patient Details  Name: Melanie Reilly MRN: [id number] Date of Birth: [birth year]  Subjective/Objective:      [name] reports that she is uninsured and is paying Dr Rudene Christians on installments. She reports that she is unable to pay $150 for Lovenox and Dr Rudene Christians was updated. [facility]-PT is recommending SNF. Blima Rich, CSW, notified Dr Rudene Christians and [name] that she will not be eligible for rehab. This Probation officer notified [place] that apparently [name] is uninsured. [place] from [place] is checking to see if they can provide some charity PT visits after discharge. Case management will follow for discharge planning.                Action/Plan:   Expected Discharge Date:                  Expected Discharge Plan:     In-House Referral:     Discharge planning Services     Post Acute Care Choice:    Choice offered to:     DME Arranged:    DME Agency:     HH Arranged:    [place] Agency:     Status of Service:     Medicare Important Message Given:    Date Medicare IM Given:    Medicare IM give by:    Date Additional Medicare IM Given:    Additional Medicare Important Message give by:     If discussed at [place] of Stay Meetings, dates discussed:    Additional Comments:  Roxine Whittinghill A, RN 09/01/2015, 10:37 AM

## 2015-09-01 NOTE — Progress Notes (Signed)
Gerald Stabs, [place] here to see patient.

## 2015-09-01 NOTE — Progress Notes (Signed)
OT Cancellation Note  Patient Details Name: Melanie Reilly MRN: [id number] DOB: 1955-08-06   Cancelled Treatment:    Reason Eval/Treat Not Completed: Other (comment). Second attempt for Occupational Therapy evaluation. Patient being placed on bedside commode.  Sharon Mt 09/01/2015, 3:44 PM

## 2015-09-01 NOTE — Progress Notes (Signed)
OT Cancellation Note  Patient Details Name: Melanie Reilly MRN: [id number] DOB: 08/10/1955   Cancelled Treatment:    Reason Eval/Treat Not Completed: Other (comment). Patient with Physical Therapy. Will re-attempt time permitting.   Sharon Mt 09/01/2015, 1:21 PM

## 2015-09-01 NOTE — Progress Notes (Signed)
Physical Therapy Treatment Patient Details Name: Melanie Reilly MRN: [id number] DOB: January 30, 1956 Today's Date: 09/01/2015    History of Present Illness Pt is s/p L THA.     PT Comments    Pt is making limited progress towards goals with increased ambulation distance this session. All mobility performed on room air with sats decreasing to 88%. 2L of O2 donned at end of session with sats improving. Pt with limited WB on L LE despite cues. Still needs +2 assist for all mobility and limited by pain. No complaints of dizziness this session.  Follow Up Recommendations  SNF     Equipment Recommendations       Recommendations for Other Services       Precautions / Restrictions Precautions Precautions: Anterior Hip;Fall Precaution Booklet Issued: Yes (comment) Restrictions Weight Bearing Restrictions: Yes LLE Weight Bearing: Weight bearing as tolerated    Mobility  Bed Mobility Overal bed mobility: Needs Assistance Bed Mobility: Supine to Sit     Supine to sit: Max assist     General bed mobility comments: assist for bed mobility. Once seated at EOB, pt able to sit with cga.   Transfers Overall transfer level: Needs assistance Equipment used: Rolling walker (2 wheeled) Transfers: Sit to/from Stand Sit to Stand: Mod assist;+2 physical assistance         General transfer comment: assist for standing. Cued for ant. translation prior to standing. Once standing, pt with limited weight bearing on L LE. Pt required cues for weight bearing.  Ambulation/Gait Ambulation/Gait assistance: Min assist;+2 physical assistance Ambulation Distance (Feet): 5 Feet Assistive device: Rolling walker (2 wheeled) Gait Pattern/deviations: Step-to pattern     General Gait Details: ambulated with step to gait pattern with rw. Pt with limited weight bearing on L LE. Heavy cues for sequencing given and pt requires +2 assistance. Antalgic gait pattern performed.   Stairs             Wheelchair Mobility    Modified Rankin (Stroke Patients Only)       Balance                                    Cognition Arousal/Alertness: Awake/alert Behavior During Therapy: Anxious Overall Cognitive Status: Within Functional Limits for tasks assessed                      Exercises Other Exercises Other Exercises: Supine ther-ex performed including L LE ankle pumps, quad sets, hip abd/add, SLRs, and glut sets. All ther-ex performed x 10 reps with mod assist for assistance.    General Comments        Pertinent Vitals/Pain Pain Assessment: 0-10 Pain Score: 6  Pain Location: L hip Pain Descriptors / Indicators: Operative site guarding Pain Intervention(s): Limited activity within patient's tolerance    Home Living                      Prior Function            PT Goals (current goals can now be found in the care plan section) Acute Rehab PT Goals Patient Stated Goal: to go home with sister PT Goal Formulation: With patient Time For Goal Achievement: 09/15/15 Potential to Achieve Goals: Good Progress towards PT goals: Progressing toward goals    Frequency  BID    PT Plan Current plan remains appropriate    Co-evaluation  End of Session Equipment Utilized During Treatment: Gait belt;Oxygen Activity Tolerance: Patient limited by pain;Patient limited by fatigue;Treatment limited secondary to medical complications (Comment) Patient left: in chair;with chair alarm set     Time: [phone] PT Time Calculation (min) (ACUTE ONLY): 25 min  Charges:  $Gait Training: 8-22 mins $Therapeutic Exercise: 8-22 mins                    G Codes:      Easton Sivertson 23-Sep-2015, 3:13 PM  Greggory Stallion, PT, DPT [phone]

## 2015-09-01 NOTE — Clinical Social Work Note (Addendum)
Clinical Social Work Assessment  Patient Details  Name: Melanie Reilly MRN: [id number] Date of Birth: [birth year]  Date of referral:  09/01/15               Reason for consult:  Facility Placement, Financial Concerns                Permission sought to share information with:  Family Supports Permission granted to share information::  Yes, Verbal Permission Granted  Name::      [place]::     Relationship::   Sister   Contact Information:   [phone]  Housing/Transportation Living arrangements for the past 2 months:  Single Family Home Source of Information:  Patient, Other (Comment Required) (Sister ) Patient Interpreter Needed:  None Criminal Activity/Legal Involvement Pertinent to Current Situation/Hospitalization:  No - Comment as needed Significant Relationships:  Adult Children, Siblings Lives with:  Roommate Do you feel safe going back to the place where you live?  Yes Need for family participation in patient care:  Yes (Comment)  Care giving concerns:  Patient lives alone in [place].    Social Worker assessment / plan:  Holiday representative ([place]) received SNF consult. PT is recommending SNF. Patient is self-pay and does not have health insurance. CSW met with patient at bedside to discuss D/C plan. Patient was lethargic however she was arousable. Patient reported that she lives in an apartment in [place] with a roommate. Patient reported that she does not have insurance and has applied to charity care at [facility]. Patient reported that she is paying Dr. Rudene Christians $1,000 for the surgery and will set up a payment plan. Patient reported that her sister Daine Floras and son Angelica Chessman are her primary supports. CSW explained that PT is recommending SNF however patient does not have a payer for SNF. CSW discussed SNF private pay options. Patient reported that she cannot pay privately for SNF. Patient reported that she is going to stay with her sister Daine Floras at [address] [place],  [place]. Patient gave CSW permission to call her sister.  CSW contacted patient's sister Susie to confirm D/C plan. CSW made sister aware of above. Sister seemed surprised to hear that patient does not have health insurance. Per sister she is on disability and does not work. Per sister she can provide 24/7 supervision for patient. Per sister she lives in a 2 story home and the patient can stay on the first floor. Per sister she has 2 stairs to get up to get in the home. CSW contacted Dr. Rudene Christians and made him aware of above. Dr. Rudene Christians is in agreement with patient going home. Per Dr. Rudene Christians patient can go on aspirin because she cannot afford Lovenox. CSW also made PT aware of above. RN Case Manager aware of above.   Plan is for patient to D/C home to her sister's house. CSW will continue to follow and assist as needed.   Employment status:  Temporary Insurance information:  Self Pay (Medicaid Pending) PT Recommendations:  [place] / Referral to community resources:  Other (Comment Required) (Home Health VS SNF )  Patient/Family's Response to care:  Patient plans on going home to her sister's house. Patient does not have a payer for SNF.   Patient/Family's Understanding of and Emotional Response to Diagnosis, Current Treatment, and Prognosis: Patient was pleasant throughout assessment.   Emotional Assessment Appearance:  Appears stated age Attitude/Demeanor/Rapport:  Lethargic Affect (typically observed):  Accepting, Adaptable, Pleasant Orientation:  Oriented to Self, Oriented to Place, Oriented to  Time, Oriented to Situation Alcohol / Substance use:  Not Applicable Psych involvement (Current and /or in the community):  No (Comment)  Discharge Needs  Concerns to be addressed:  Discharge Planning Concerns Readmission within the last 30 days:  No Current discharge risk:  Dependent with Mobility Barriers to Discharge:  Continued Medical Work up   Loralyn Freshwater,  LCSW 09/01/2015, 11:29 AM

## 2015-09-01 NOTE — Progress Notes (Signed)
   Subjective: 1 Day Post-Op Procedure(s) (LRB): TOTAL HIP ARTHROPLASTY ANTERIOR APPROACH (Left) Patient reports pain as 8 on 0-10 scale.   Patient is well, and has had no acute complaints or problems Denies any CP, SOB, ABD pain. We will continue therapy today.  Plan is to go Home after hospital stay.  Objective: Vital signs in last 24 hours: Temp:  [97.5 F (36.4 C)-99.2 F (37.3 C)] 99 F (37.2 C) (02/22 0724) Pulse Rate:  [68-97] 76 (02/22 0724) Resp:  [12-18] 17 (02/22 0724) BP: (96-181)/(49-78) 135/70 mmHg (02/22 0724) SpO2:  [92 %-100 %] 95 % (02/22 0724) Weight:  [89.812 kg (198 lb)-98.567 kg (217 lb 4.8 oz)] 98.567 kg (217 lb 4.8 oz) (02/21 1437)  Intake/Output from previous day: 02/21 0701 - 02/22 0700 In: 2982 [I.V.:2832; IV Piggyback:150] Out: 1910 [Urine:1710; Blood:200] Intake/Output this shift:     Recent Labs  08/31/15 1445  HGB 13.8    Recent Labs  08/31/15 1445  WBC 12.4*  RBC 4.62  HCT 42.4  PLT 218    Recent Labs  08/31/15 1445  CREATININE 0.62   No results for input(s): LABPT, INR in the last 72 hours.  EXAM General - Patient is Alert, Appropriate and Oriented Extremity - Neurovascular intact Sensation intact distally Intact pulses distally Dorsiflexion/Plantar flexion intact Dressing - dressing C/D/I and scant drainage Motor Function - intact, moving foot and toes well on exam.   Past Medical History  Diagnosis Date  . Complication of anesthesia     my COPD, I had difficulty breathing when I woke up.  Marland Kitchen COPD (chronic obstructive pulmonary disease) ([place])   . Hypertension   . Depression   . Arthritis   . Cancer ([place]) 2016    skin cancer on forehead    Assessment/Plan:   1 Day Post-Op Procedure(s) (LRB): TOTAL HIP ARTHROPLASTY ANTERIOR APPROACH (Left) Active Problems:   Primary osteoarthritis of left hip  Estimated body mass index is 31.18 kg/(m^2) as calculated from the following:   Height as of this encounter: 5\' 10"   (1.778 m).   Weight as of this encounter: 98.567 kg (217 lb 4.8 oz). Advance diet Up with therapy  Needs BM Recheck labs in the am  DVT Prophylaxis - Lovenox, Foot Pumps and TED hose Weight-Bearing as tolerated to left leg D/C O2 and Pulse OX and try on Room Air  T. Rachelle Hora, PA-C [place] 09/01/2015, 8:08 AM

## 2015-09-01 NOTE — Evaluation (Signed)
Physical Therapy Evaluation Patient Details Name: Melanie Reilly MRN: [id number] DOB: 01/11/1956 Today's Date: 09/01/2015   History of Present Illness  Pt is s/p L THA.   Clinical Impression  Pt is a pleasant 60 year old female who was admitted for L THA. Pt performs bed mobility with max assist and transfers with max assist and rw. Once seated at EOB, pt becomes sweaty, increased HR, and increased dizziness. Pt returned back to bed and RN notified. Pt initially on room air upon arrival with sats at 92%. However with exertion, O2 sats decrease to 87% on room air. Pt returned back to 2L of O2 with sats returning to WNL. Pt demonstrates deficits with strength/mobility. Would benefit from skilled PT to address above deficits and promote optimal return to PLOF; recommend transition to STR upon discharge from acute hospitalization.       Follow Up Recommendations SNF    Equipment Recommendations       Recommendations for Other Services       Precautions / Restrictions Precautions Precautions: Anterior Hip;Fall Precaution Booklet Issued: No Restrictions Weight Bearing Restrictions: Yes LLE Weight Bearing: Weight bearing as tolerated      Mobility  Bed Mobility Overal bed mobility: Needs Assistance Bed Mobility: Supine to Sit     Supine to sit: Max assist     General bed mobility comments: assist for bed mobility. Pt able to grab handrail, however needs max assist for trunk mobility. Once seated at EOB, pt able to sit with supervision. +2 assist for return back to bed along with scooting up towards [place].  Transfers Overall transfer level: Needs assistance Equipment used: Rolling walker (2 wheeled) Transfers: Sit to/from Stand Sit to Stand: Max assist         General transfer comment: 2 attempts for standing with rw. Pt cued for correct hand placement. Pt becomes diaphoretic and fatigues; along with nausea symptoms; with exertion. Further attempts  deferred.  Ambulation/Gait             General Gait Details: unable at this time  Stairs            Wheelchair Mobility    Modified Rankin (Stroke Patients Only)       Balance Overall balance assessment: Needs assistance Sitting-balance support: Feet supported;Bilateral upper extremity supported Sitting balance-Leahy Scale: Good     Standing balance support: Bilateral upper extremity supported Standing balance-Leahy Scale: Poor                               Pertinent Vitals/Pain Pain Assessment: 0-10 Pain Score: 6  Pain Location: L hip Pain Descriptors / Indicators: Operative site guarding Pain Intervention(s): Limited activity within patient's tolerance    Home Living Family/patient expects to be discharged to:: Private residence Living Arrangements:  (lives with sister) Available Help at Discharge: Family Type of Home: House Home Access: Stairs to enter Entrance Stairs-Rails: Can reach both Entrance Stairs-Number of Steps: 3 Home Layout: One level Home Equipment: Walker - 2 wheels      Prior Function Level of Independence: Independent               Hand Dominance        Extremity/Trunk Assessment   Upper Extremity Assessment: Generalized weakness (grossly 4/5)           Lower Extremity Assessment: Generalized weakness (L LE grossly 2/5; R LE grossly 3+5)  Communication   Communication: No difficulties  Cognition Arousal/Alertness: Awake/alert Behavior During Therapy: Anxious Overall Cognitive Status: Within Functional Limits for tasks assessed                      General Comments      Exercises Other Exercises Other Exercises: supine ther-ex performed including L LE ankle pumps, quad sets, glut sets, hip abd/add, and SLRs. All ther-ex performed x 10 reps with mod assist for correct technique. Other Exercises: Pt requested to be placed on bed pan once seated at EOB. Pt unable to transfer to [facility]  therefore, stood twice with bedpan placed under pt. RN notified.      Assessment/Plan    PT Assessment Patient needs continued PT services  PT Diagnosis Difficulty walking;Abnormality of gait;Generalized weakness;Acute pain   PT Problem List Decreased strength;Decreased activity tolerance;Decreased mobility;Decreased knowledge of use of DME;Cardiopulmonary status limiting activity;Pain  PT Treatment Interventions DME instruction;Gait training;Therapeutic exercise   PT Goals (Current goals can be found in the Care Plan section) Acute Rehab PT Goals Patient Stated Goal: to go home with sister PT Goal Formulation: With patient Time For Goal Achievement: 09/15/15 Potential to Achieve Goals: Good    Frequency BID   Barriers to discharge        Co-evaluation               End of Session Equipment Utilized During Treatment: Gait belt;Oxygen Activity Tolerance: Patient limited by pain;Patient limited by fatigue;Treatment limited secondary to medical complications (Comment) Patient left: in bed;with bed alarm set;with SCD's reapplied Nurse Communication: Mobility status         Time: [id number] PT Time Calculation (min) (ACUTE ONLY): 38 min   Charges:   PT Evaluation $PT Eval Moderate Complexity: 1 Procedure PT Treatments $Therapeutic Exercise: 8-22 mins $Therapeutic Activity: 8-22 mins   PT G Codes:        Almando Brawley 09/08/2015, 10:40 AM  Greggory Stallion, PT, DPT [phone]

## 2015-09-02 LAB — BASIC METABOLIC PANEL
Anion gap: 4 — ABNORMAL LOW (ref 5–15)
BUN: 11 mg/dL (ref 6–20)
CO2: 32 mmol/L (ref 22–32)
Calcium: 8.3 mg/dL — ABNORMAL LOW (ref 8.9–10.3)
Chloride: 104 mmol/L (ref 101–111)
Creatinine, Ser: 0.43 mg/dL — ABNORMAL LOW (ref 0.44–1.00)
Glucose, Bld: 143 mg/dL — ABNORMAL HIGH (ref 65–99)
POTASSIUM: 3.6 mmol/L (ref 3.5–5.1)
Sodium: 140 mmol/L (ref 135–145)

## 2015-09-02 LAB — CBC
HCT: 36.7 % (ref 35.0–47.0)
Hemoglobin: 12.5 g/dL (ref 12.0–16.0)
MCH: 30.2 pg (ref 26.0–34.0)
MCHC: 34 g/dL (ref 32.0–36.0)
MCV: 88.7 fL (ref 80.0–100.0)
PLATELETS: 187 10*3/uL (ref 150–440)
RBC: 4.14 MIL/uL (ref 3.80–5.20)
RDW: 13.1 % (ref 11.5–14.5)
WBC: 10.6 10*3/uL (ref 3.6–11.0)

## 2015-09-02 LAB — SURGICAL PATHOLOGY

## 2015-09-02 MED ORDER — BISACODYL 10 MG RE SUPP
10.0000 mg | Freq: Once | RECTAL | Status: AC
  Administered 2015-09-02: 10 mg via RECTAL
  Filled 2015-09-02: qty 1

## 2015-09-02 MED ORDER — MAGNESIUM CITRATE PO SOLN
1.0000 | Freq: Every day | ORAL | Status: DC | PRN
  Administered 2015-09-03: 1 via ORAL
  Filled 2015-09-02 (×2): qty 296

## 2015-09-02 NOTE — Progress Notes (Signed)
Melanie Reilly admissions coordinator at [facility] offered a 2 week LOG. Clinical Social Worker (CSW) met with patient and made her aware of above. Per patient she still prefers to go home and wants to speak with her sister about a final D/C plan. CSW contacted patient's sister Melanie Reilly and made her aware of above. Per sister she can pick patient up tomorrow if she decides to come home with her. Sister is agreeable for patient to come home with her. CSW will continue to follow and assist as needed.   Blima Rich, LCSW [phone]

## 2015-09-02 NOTE — Progress Notes (Signed)
Pt still with no bm,spoke with chris gaines ,pa who ordered dulcolax supp once and magnesium citrate 1 bottle qd prn

## 2015-09-02 NOTE — Evaluation (Signed)
Occupational Therapy Evaluation Patient Details Name: Melanie Reilly MRN: [id number] DOB: Jan 07, 1956 Today's Date: 09/02/2015    History of Present Illness This patient is a 60 year old female who came to [facility] for a L THA (anterior).   Clinical Impression   This patient is a 60 year old female who came to [facility] for a L total knee hip replacement.  Patient lives in a one story home with 3 steps to enter and a rail.  She had been independent with ADL and functional mobility. She now has deficits with pain, mobility and activities of daily living.  She would benefit from Occupational Therapy for ADL/functional mobility training while .staying within hip precautions (anterior approach)       Follow Up Recommendations  SNF    Equipment Recommendations       Recommendations for Other Services       Precautions / Restrictions Precautions Precautions: Anterior Hip;Fall Precaution Booklet Issued: Yes (comment) (per PT) Restrictions LLE Weight Bearing: Weight bearing as tolerated      Mobility Bed Mobility                  Transfers                      Balance                                            ADL                                         General ADL Comments: Patient had been independent with basic ADL. Today practiced techniques for lower body dressing using hip kit as she can not reach her feet. Practiced techniques to Donned/doffed socks and pants to knees (patient had no clothing here). She needed hand over hand assist with physical and verbal cues for technique and safety.     Vision     Perception     Praxis      Pertinent Vitals/Pain Pain Score: 7  Pain Location: L hip Pain Descriptors / Indicators: Operative site guarding     Hand Dominance     Extremity/Trunk Assessment Upper Extremity Assessment Upper Extremity Assessment: Overall WFL for tasks assessed   Lower  Extremity Assessment Lower Extremity Assessment: Defer to PT evaluation       Communication Communication Communication: No difficulties   Cognition Arousal/Alertness: Awake/alert Behavior During Therapy: WFL for tasks assessed/performed Overall Cognitive Status: Within Functional Limits for tasks assessed                     General Comments       Exercises       Shoulder Instructions      Home Living Family/patient expects to be discharged to:: Private residence Living Arrangements:  (with sister.) Available Help at Discharge: Family Type of Home: House Home Access: Stairs to enter Technical brewer of Steps: 3 Entrance Stairs-Rails: Can reach both Home Layout: One level               Home Equipment: Walker - 2 wheels          Prior Functioning/Environment Level of Independence: Independent  OT Diagnosis: Acute pain   OT Problem List: Decreased range of motion;Decreased activity tolerance;Impaired balance (sitting and/or standing);Decreased knowledge of use of DME or AE;Decreased knowledge of precautions;Pain   OT Treatment/Interventions: Self-care/ADL training    OT Goals(Current goals can be found in the care plan section) Acute Rehab OT Goals Patient Stated Goal: to go home with sister OT Goal Formulation: With patient Time For Goal Achievement: 09/16/15 Potential to Achieve Goals: Good  OT Frequency: Min 1X/week   Barriers to D/C:            Co-evaluation              End of Session Equipment Utilized During Treatment:  (hip kit)  Activity Tolerance: Patient limited by pain Patient left: in bed;with call bell/phone within reach;with bed alarm set   Time: 0944-1000 OT Time Calculation (min): 16 min Charges:  OT General Charges $OT Visit: 1 Procedure OT Evaluation $OT Eval Low Complexity: 1 Procedure G-Codes:    Myrene Galas, MS/OTR/L  09/02/2015, 10:08 AM

## 2015-09-02 NOTE — Progress Notes (Signed)
PT continues to recommend SNF today. Clinical Education officer, museum (CSW) discussed case with [place] Work Director who approved a 2 week LOG including PT. CSW met with patient to discuss D/C plan. CSW explained to patient that she will discharge tomorrow and she can go home or go to a SNF for 2 weeks. CSW explained that LOG bed will likely be outside of [facility]. Patient requested CSW to call her sister Daine Floras. CSW contacted Susie who reported that she is driving and will call CSW back when she gets home. Per sister she is willing to take patient home with her however she cannot lift her. Per sister she called patient's insurance through her work and they said they will pay for 6 PT sessions at home. CSW made RN Case Manager aware of above.   CSW contacted Tammy at [facility] to review LOG. CSW left [facility] admissions coordinator at [facility] a voicemail asking him to review referral. CSW will continue to follow and assist as needed.   Blima Rich, LCSW [phone]

## 2015-09-02 NOTE — Progress Notes (Signed)
Physical Therapy Treatment Patient Details Name: Melanie Reilly MRN: [id number] DOB: 1956/02/11 Today's Date: 09/02/2015    History of Present Illness Pt is s/p L THA.     PT Comments    Pt motiiated to participate in therapy session this afternoon.  Pt was able to stand with contact guard +1 with increased time.  Ambulation distances improved but remain limited.  She had difficulty at times advancing left leg, initially needing assistance but by end of session able to advance on her own.  She does require minimal assistance with bed mobility to manage lower legs.  Pt will need to practice stairs in the morning if she is discharging home verses short term rehab.  Follow Up Recommendations        Equipment Recommendations       Recommendations for Other Services       Precautions / Restrictions Precautions Precautions: Anterior Hip Restrictions Weight Bearing Restrictions: Yes LLE Weight Bearing: Weight bearing as tolerated    Mobility  Bed Mobility Overal bed mobility: Needs Assistance (for legs) Bed Mobility: Sit to Supine     Supine to sit: Min assist     General bed mobility comments:  (unable to get le's on bed independantly)  Transfers Overall transfer level: Needs assistance Equipment used: Rolling walker (2 wheeled) Transfers: Sit to/from Stand Sit to Stand: Min guard            Ambulation/Gait Ambulation/Gait assistance: Min guard (occasional assist to advance left le) Ambulation Distance (Feet): 15 Feet (amb around bed to/from commode) Assistive device: Rolling walker (2 wheeled) Gait Pattern/deviations: Decreased step length - left;Decreased stance time - left;Step-to pattern     General Gait Details: gait slow but steady   Science writer    Modified Rankin (Stroke Patients Only)       Balance Overall balance assessment: Modified Independent Sitting-balance support: Feet supported Sitting balance-Leahy  Scale: Good     Standing balance support: Bilateral upper extremity supported Standing balance-Leahy Scale: Poor                      Cognition Arousal/Alertness: Awake/alert Behavior During Therapy: WFL for tasks assessed/performed Overall Cognitive Status: Within Functional Limits for tasks assessed                      Exercises      General Comments        Pertinent Vitals/Pain Pain Assessment: 0-10 Pain Score: 6  Pain Location: L hip Pain Descriptors / Indicators: Operative site guarding Pain Intervention(s): Limited activity within patient's tolerance    Home Living                      Prior Function            PT Goals (current goals can now be found in the care plan section) Progress towards PT goals: Progressing toward goals    Frequency  BID    PT Plan Current plan remains appropriate    Co-evaluation             End of Session Equipment Utilized During Treatment: Gait belt Activity Tolerance: Patient tolerated treatment well Patient left: in bed;with bed alarm set;with call bell/phone within reach     Time: 1410-1449 PT Time Calculation (min) (ACUTE ONLY): 39 min  Charges:  $Gait Training: 23-37 mins $Therapeutic Activity: 8-22 mins  G Codes:      Chesley Noon, PTA 09/02/2015, 2:59 PM

## 2015-09-02 NOTE — Progress Notes (Signed)
   Subjective: 2 Days Post-Op Procedure(s) (LRB): TOTAL HIP ARTHROPLASTY ANTERIOR APPROACH (Left) Patient reports pain as 8 on 0-10 scale.   Patient is well, and has had no acute complaints or problems Denies any CP, SOB, ABD pain. Decreased O2 sats with PT. We will continue therapy today.  Plan is to go Skilled nursing facility after hospital stay.  Objective: Vital signs in last 24 hours: Temp:  [98.3 F (36.8 C)-99.6 F (37.6 C)] 98.4 F (36.9 C) (02/23 0359) Pulse Rate:  [76-91] 88 (02/23 0359) Resp:  [17-18] 18 (02/23 0359) BP: (108-135)/(56-70) 126/57 mmHg (02/23 0359) SpO2:  [91 %-96 %] 95 % (02/23 0359)  Intake/Output from previous day: 02/22 0701 - 02/23 0700 In: 1136.3 [P.O.:240; I.V.:896.3] Out: 1025 [Urine:1025] Intake/Output this shift:     Recent Labs  08/31/15 1445 09/02/15 0551  HGB 13.8 12.5    Recent Labs  08/31/15 1445 09/02/15 0551  WBC 12.4* 10.6  RBC 4.62 4.14  HCT 42.4 36.7  PLT 218 187    Recent Labs  08/31/15 1445 09/02/15 0551  NA  --  140  K  --  3.6  CL  --  104  CO2  --  32  BUN  --  11  CREATININE 0.62 0.43*  GLUCOSE  --  143*  CALCIUM  --  8.3*   No results for input(s): LABPT, INR in the last 72 hours.  EXAM General - Patient is Alert, Appropriate and Oriented Extremity - Neurovascular intact Sensation intact distally Intact pulses distally Dorsiflexion/Plantar flexion intact Dressing - dressing C/D/I and scant drainage Motor Function - intact, moving foot and toes well on exam.   Past Medical History  Diagnosis Date  . Complication of anesthesia     my COPD, I had difficulty breathing when I woke up.  Marland Kitchen COPD (chronic obstructive pulmonary disease) ([place])   . Hypertension   . Depression   . Arthritis   . Cancer ([place]) 2016    skin cancer on forehead    Assessment/Plan:   2 Days Post-Op Procedure(s) (LRB): TOTAL HIP ARTHROPLASTY ANTERIOR APPROACH (Left) Active Problems:   Primary osteoarthritis of left  hip  Estimated body mass index is 31.18 kg/(m^2) as calculated from the following:   Height as of this encounter: 5\' 10"  (1.778 m).   Weight as of this encounter: 98.567 kg (217 lb 4.8 oz). Advance diet Up with therapy  Needs BM Encouraged incentive spirometer Discharge to SNF tomorrow   DVT Prophylaxis - Aspirin, Lovenox, Foot Pumps and TED hose Weight-Bearing as tolerated to left leg D/C O2 and Pulse OX and try on Room Air  T. Rachelle Hora, PA-C [place] 09/02/2015, 7:20 AM

## 2015-09-02 NOTE — Progress Notes (Signed)
Physical Therapy Treatment Patient Details Name: Melanie Reilly MRN: [id number] DOB: 1955-12-21 Today's Date: 09/02/2015    History of Present Illness Pt is s/p L THA.     PT Comments    Pt with improved tolerance to all activities this am.  Demonstrated improved transfers performing supine to sit with minA for sequencing and maintaining precautions and requiring add'l time.  Pt with decreased dependence for transfers and able to perform seated and supine therex with good tolerance.  She would con't to benefit from skilled PT to increased strength and functional mobility tolerance .   Follow Up Recommendations  SNF;Other (comment) (Pt requesting HHPT )     Equipment Recommendations       Recommendations for Other Services       Precautions / Restrictions Precautions Precautions: Anterior Hip Precaution Booklet Issued: No (already rcvd) Restrictions Weight Bearing Restrictions: Yes LLE Weight Bearing: Weight bearing as tolerated    Mobility  Bed Mobility Overal bed mobility: Needs Assistance Bed Mobility: Supine to Sit     Supine to sit: Min assist     General bed mobility comments: Pt able to inch LLE to side and  with add'l time demonstrating increased independence with bed mobility   Transfers Overall transfer level: Needs assistance Equipment used: Rolling walker (2 wheeled) Transfers: Sit to/from Stand Sit to Stand: Min assist         General transfer comment: Cues for PHP and sequencing  Ambulation/Gait Ambulation/Gait assistance: Min guard Ambulation Distance (Feet): 5 Feet Assistive device: Rolling walker (2 wheeled) Gait Pattern/deviations: Step-to pattern;Decreased weight shift to left     General Gait Details: ambulated to recliner chair, decreased R foot clearance slow gait    Stairs            Wheelchair Mobility    Modified Rankin (Stroke Patients Only)       Balance Overall balance assessment: Needs assistance Sitting-balance  support: Feet supported Sitting balance-Leahy Scale: Good     Standing balance support: Bilateral upper extremity supported Standing balance-Leahy Scale: Fair Standing balance comment: No posterior lean with transfer, pt able to stand and attempt wt shifting in standing while maintaining F balance                     Cognition Arousal/Alertness: Awake/alert Behavior During Therapy: WFL for tasks assessed/performed Overall Cognitive Status: Within Functional Limits for tasks assessed                      Exercises Other Exercises Other Exercises: supine ap,QS, GS  SAQ, hip abd, AA heel slides, sitting LAQ x 12-15 b/l     General Comments        Pertinent Vitals/Pain Pain Assessment: 0-10 Pain Score: 7  Pain Location: L hip Pain Descriptors / Indicators: Operative site guarding Pain Intervention(s): Limited activity within patient's tolerance    Home Living Family/patient expects to be discharged to:: Private residence Living Arrangements:  (with sister.) Available Help at Discharge: Family Type of Home: House Home Access: Stairs to enter Entrance Stairs-Rails: Can reach both Home Layout: One level Home Equipment: Environmental consultant - 2 wheels      Prior Function Level of Independence: Independent          PT Goals (current goals can now be found in the care plan section) Acute Rehab PT Goals Patient Stated Goal: to go home with sister PT Goal Formulation: With patient Time For Goal Achievement: 09/15/15 Potential to Achieve Goals:  Fair Progress towards PT goals: Progressing toward goals    Frequency  BID    PT Plan Current plan remains appropriate    Co-evaluation             End of Session Equipment Utilized During Treatment: Gait belt;Oxygen Activity Tolerance: Patient tolerated treatment well Patient left: in chair;with call bell/phone within reach;with chair alarm set     Time: 1000-1040 PT Time Calculation (min) (ACUTE ONLY): 40  min  Charges:  $Therapeutic Exercise: 23-37 mins $Therapeutic Activity: 8-22 mins                    G Codes:      Melanie Reilly 09/19/2015, 12:21 PM  Melanie Reilly, PTA

## 2015-09-03 MED ORDER — CEPHALEXIN 500 MG PO CAPS
500.0000 mg | ORAL_CAPSULE | Freq: Four times a day (QID) | ORAL | Status: DC
Start: 2015-09-03 — End: 2022-06-08

## 2015-09-03 MED ORDER — CEPHALEXIN 500 MG PO CAPS
500.0000 mg | ORAL_CAPSULE | Freq: Four times a day (QID) | ORAL | Status: DC
  Administered 2015-09-03: 500 mg via ORAL
  Filled 2015-09-03: qty 1

## 2015-09-03 MED ORDER — TRAMADOL HCL 50 MG PO TABS: 50.0000 mg | ORAL_TABLET | Freq: Four times a day (QID) | ORAL | Status: DC | PRN

## 2015-09-03 MED ORDER — HYDROCODONE-ACETAMINOPHEN 5-325 MG PO TABS: 1.0000 | ORAL_TABLET | ORAL | Status: DC | PRN

## 2015-09-03 MED ORDER — ENOXAPARIN SODIUM 40 MG/0.4ML ~~LOC~~ SOLN: 40.0000 mg | SUBCUTANEOUS | Status: DC

## 2015-09-03 NOTE — Progress Notes (Signed)
OT Cancellation Note  Patient Details Name: Melanie Reilly MRN: [id number] DOB: 06-30-1956   Cancelled Treatment:    Reason Eval/Treat Not Completed: Medical issues which prohibited therapy. Not seen, BP 95/31.  Sharon Mt 09/03/2015, 3:56 PM

## 2015-09-03 NOTE — Progress Notes (Signed)
Patient told me her son was unable to pick her up. Called EMS and they came to transport her. I also called to update Melissa at [facility].

## 2015-09-03 NOTE — Clinical Social Work Placement (Signed)
   CLINICAL SOCIAL WORK PLACEMENT  NOTE  Date:  09/03/2015  Patient Details  Name: Melanie Reilly MRN: [id number] Date of Birth: [birth year]  Clinical Social Work is seeking post-discharge placement for this patient at the [place] level of care (*CSW will initial, date and re-position this form in  chart as items are completed):  Yes   Patient/family provided with [facility] Work Department's list of facilities offering this level of care within the geographic area requested by the patient (or if unable, by the patient's family).  Yes   Patient/family informed of their freedom to choose among providers that offer the needed level of care, that participate in Medicare, Medicaid or managed care program needed by the patient, have an available bed and are willing to accept the patient.  Yes   Patient/family informed of [facility]'s ownership interest in [facility] and [facility], as well as of the fact that they are under no obligation to receive care at these facilities.  PASRR submitted to EDS on 09/01/15     PASRR number received on 09/01/15     Existing PASRR number confirmed on       FL2 transmitted to all facilities in geographic area requested by pt/family on 09/02/15     FL2 transmitted to all facilities within larger geographic area on       Patient informed that his/her managed care company has contracts with or will negotiate with certain facilities, including the following:        Yes   Patient/family informed of bed offers received.  Patient chooses bed at  ([facility] (2 week LOG) )     Physician recommends and patient chooses bed at      Patient to be transferred to  ([facility] ) on 09/03/15.  Patient to be transferred to facility by  (Patient's son Angelica Chessman will transport patient in personal vehicle. )     Patient family notified on 09/03/15 of transfer.  Name of family member notified:   (CSW left patient's sister  Susie a voicemail making her aware of D/C today. )     PHYSICIAN       Additional Comment:    _______________________________________________ Loralyn Freshwater, LCSW 09/03/2015, 10:52 AM

## 2015-09-03 NOTE — Progress Notes (Signed)
Physical Therapy Treatment Patient Details Name: Melanie Reilly MRN: [id number] DOB: [birth year] Today's Date: 09/03/2015    History of Present Illness 60 y/o female here with L total hip    PT Comments    Pt continues to be very slow and cautious with mobility and is unable to ambulate very efficiently or very far.  She was wanting to go home, but realizes that she is not able to safely do that.  She did show good effort with bed mobility and is making gains in that regard, but ultimately will do much better with rehab.   Follow Up Recommendations  SNF     Equipment Recommendations       Recommendations for Other Services       Precautions / Restrictions Precautions Precautions: Anterior Hip Restrictions LLE Weight Bearing: Weight bearing as tolerated    Mobility  Bed Mobility Overal bed mobility: Needs Assistance Bed Mobility: Sit to Supine     Supine to sit: Min assist     General bed mobility comments: Pt shows good effort with getting to EOB and though she is fearful and somewhat impulsive.  Transfers Overall transfer level: Needs assistance Equipment used: Rolling walker (2 wheeled) Transfers: Sit to/from Stand Sit to Stand: Min guard         General transfer comment: Pt was able to rise to standing with very little assist.  She is able to maintain balance in standing w/ only CGA.  Ambulation/Gait Ambulation/Gait assistance: Min assist;Min guard Ambulation Distance (Feet): 35 Feet Assistive device: Rolling walker (2 wheeled)       General Gait Details: Pt continues to be cautious and slow with ambulation.  She continues to ste-to pattern and though she is able to walk farther she is still slow and labored with the effort.    Stairs            Wheelchair Mobility    Modified Rankin (Stroke Patients Only)       Balance                                    Cognition Arousal/Alertness: Awake/alert Behavior During Therapy: WFL  for tasks assessed/performed Overall Cognitive Status: Within Functional Limits for tasks assessed                      Exercises General Exercises - Lower Extremity Ankle Circles/Pumps: AROM;10 reps Quad Sets: Strengthening;10 reps Gluteal Sets: Strengthening;10 reps Long Arc Quad: AROM;AAROM;10 reps Hip ABduction/ADduction: AAROM;AROM;10 reps Hip Flexion/Marching: AAROM;10 reps;AROM    General Comments        Pertinent Vitals/Pain Pain Score: 5     Home Living                      Prior Function            PT Goals (current goals can now be found in the care plan section) Progress towards PT goals: Progressing toward goals    Frequency  BID    PT Plan Current plan remains appropriate    Co-evaluation             End of Session Equipment Utilized During Treatment: Gait belt Activity Tolerance: Patient limited by fatigue Patient left: with chair alarm set;with call bell/phone within reach     Time: 0830-0858 PT Time Calculation (min) (ACUTE ONLY): 28 min  Charges:  $Gait Training:  8-22 mins $Therapeutic Exercise: 8-22 mins                    G Codes:      Wayne Both, [place], DPT [phone]  Kreg Shropshire 09/03/2015, 10:50 AM

## 2015-09-03 NOTE — Progress Notes (Addendum)
   Subjective: 3 Days Post-Op Procedure(s) (LRB): TOTAL HIP ARTHROPLASTY ANTERIOR APPROACH (Left) Patient reports pain as 3 on 0-10 scale.   Patient is well, and has had no acute complaints or problems Denies any CP, SOB, ABD pain. Decreased O2 sats with PT. We will continue therapy today.  Plan is to go Skilled nursing facility after hospital stay.  Objective: Vital signs in last 24 hours: Temp:  [98.1 F (36.7 C)-98.8 F (37.1 C)] 98.8 F (37.1 C) (02/24 0708) Pulse Rate:  [88-100] 88 (02/24 0708) Resp:  [18-19] 18 (02/24 0708) BP: (105-140)/(53-65) 105/60 mmHg (02/24 0708) SpO2:  [91 %-98 %] 93 % (02/24 0708)  Intake/Output from previous day: 02/23 0701 - 02/24 0700 In: 480 [P.O.:480] Out: -  Intake/Output this shift:     Recent Labs  08/31/15 1445 09/02/15 0551  HGB 13.8 12.5    Recent Labs  08/31/15 1445 09/02/15 0551  WBC 12.4* 10.6  RBC 4.62 4.14  HCT 42.4 36.7  PLT 218 187    Recent Labs  08/31/15 1445 09/02/15 0551  NA  --  140  K  --  3.6  CL  --  104  CO2  --  32  BUN  --  11  CREATININE 0.62 0.43*  GLUCOSE  --  143*  CALCIUM  --  8.3*   No results for input(s): LABPT, INR in the last 72 hours.  EXAM General - Patient is Alert, Appropriate and Oriented Extremity - Neurovascular intact Sensation intact distally Intact pulses distally Dorsiflexion/Plantar flexion intact  Mild erythema left thigh Dressing - dressing C/D/I and scant drainage Motor Function - intact, moving foot and toes well on exam.   Past Medical History  Diagnosis Date  . Complication of anesthesia     my COPD, I had difficulty breathing when I woke up.  Marland Kitchen COPD (chronic obstructive pulmonary disease) ([place])   . Hypertension   . Depression   . Arthritis   . Cancer ([place]) 2016    skin cancer on forehead    Assessment/Plan:   3 Days Post-Op Procedure(s) (LRB): TOTAL HIP ARTHROPLASTY ANTERIOR APPROACH (Left) Active Problems:   Primary osteoarthritis of left  hip  Estimated body mass index is 31.18 kg/(m^2) as calculated from the following:   Height as of this encounter: 5\' 10"  (1.778 m).   Weight as of this encounter: 98.567 kg (217 lb 4.8 oz). Advance diet Up with therapy  Encouraged incentive spirometer Discharge to SNF today Erythema left thigh, no fevers. Start keflex   DVT Prophylaxis - Aspirin, Lovenox, Foot Pumps and TED hose Weight-Bearing as tolerated to left leg D/C O2 and Pulse OX and try on Room Air  T. Rachelle Hora, PA-C [place] 09/03/2015, 8:11 AM

## 2015-09-03 NOTE — Discharge Instructions (Signed)

## 2015-09-03 NOTE — Progress Notes (Addendum)
Patient being discharged to [facility] today. Son will be driving patient. VSS at time of discharge. Belongings packed. IV removed. Envelope for Rehab given to patient. Report called to Melissa at [facility].

## 2015-09-03 NOTE — Discharge Summary (Signed)
Physician Discharge Summary  Patient ID: Melanie Reilly MRN: [id number] DOB/AGE: 59-Jan-1957 59 y.o.  Admit date: 08/31/2015 Discharge date: 09/03/2015  Admission Diagnoses:  osteoarthritis   Discharge Diagnoses: Patient Active Problem List   Diagnosis Date Noted  . Primary osteoarthritis of left hip 08/31/2015    Past Medical History  Diagnosis Date  . Complication of anesthesia     my COPD, I had difficulty breathing when I woke up.  Marland Kitchen COPD (chronic obstructive pulmonary disease) ([place])   . Hypertension   . Depression   . Arthritis   . Cancer ([place]) 2016    skin cancer on forehead     Transfusion: none   Consultants (if any):    Discharged Condition: Improved  Hospital Course: Melanie Reilly is an 60 y.o. female who was admitted 08/31/2015 with a diagnosis of left hip OA and went to the operating room on 08/31/2015 and underwent the above named procedures.    Surgeries: Procedure(s): TOTAL HIP ARTHROPLASTY ANTERIOR APPROACH on 08/31/2015 Patient tolerated the surgery well. Taken to PACU where she was stabilized and then transferred to the orthopedic floor.  Started on Lovenox 40 q 24 hrs. Foot pumps applied bilaterally at 80 mm. Heels elevated on bed with rolled towels. No evidence of DVT. Negative Homan. Physical therapy started on day #1 for gait training and transfer. OT started day #1 for ADL and assisted devices.  Patient's foley was d/c on day #1. Patient's IV was d/c on day #2.  On post op day #3 patient was stable and ready for discharge to SNF.  Implants: Medacta AMIS 3 standard stem, 52 mm Mpact cup DM with M 28 mm head and liner  She was given perioperative antibiotics:      Anti-infectives    Start     Dose/Rate Route Frequency Ordered Stop   09/03/15 0830  cephALEXin (KEFLEX) capsule 500 mg     500 mg Oral 4 times per day 09/03/15 0824     08/31/15 1700  ceFAZolin (ANCEF) IVPB 2 g/50 mL premix     2 g 100 mL/hr over 30 Minutes Intravenous Every 6 hours  08/31/15 1419 09/01/15 0617   08/31/15 1030  ceFAZolin (ANCEF) IVPB 2 g/50 mL premix  Status:  Discontinued     2 g 100 mL/hr over 30 Minutes Intravenous  Once 08/31/15 1015 08/31/15 1418   08/31/15 0852  ceFAZolin (ANCEF) 2-3 GM-% IVPB SOLR    Comments:  KENNEDY, ASHLEY: cabinet override      08/31/15 0852 08/31/15 1111    .  She was given sequential compression devices, early ambulation, and lovenox for DVT prophylaxis.  She benefited maximally from the hospital stay and there were no complications.    Recent vital signs:  Filed Vitals:   09/03/15 0406 09/03/15 0708  BP: 117/54 105/60  Pulse: 91 88  Temp: 98.8 F (37.1 C) 98.8 F (37.1 C)  Resp: 18 18    Recent laboratory studies:  Lab Results  Component Value Date   HGB 12.5 09/02/2015   HGB 13.8 08/31/2015   HGB 14.7 08/19/2015   Lab Results  Component Value Date   WBC 10.6 09/02/2015   PLT 187 09/02/2015   Lab Results  Component Value Date   INR 1.07 08/19/2015   Lab Results  Component Value Date   NA 140 09/02/2015   K 3.6 09/02/2015   CL 104 09/02/2015   CO2 32 09/02/2015   BUN 11 09/02/2015   CREATININE 0.43* 09/02/2015  GLUCOSE 143* 09/02/2015    Discharge Medications:     Medication List    TAKE these medications        Albuterol Sulfate 108 (90 Base) MCG/ACT Aepb  Inhale 1 puff into the lungs every 4 (four) hours as needed (wheezing, shortness of breath).     aspirin 81 MG tablet  Take 81 mg by mouth at bedtime.     enoxaparin 40 MG/0.4ML injection  Commonly known as:  LOVENOX  Inject 0.4 mLs (40 mg total) into the skin daily.     hydrochlorothiazide 12.5 MG capsule  Commonly known as:  MICROZIDE  Take 12.5 mg by mouth every morning.     HYDROcodone-acetaminophen 5-325 MG tablet  Commonly known as:  NORCO/VICODIN  Take 1-2 tablets by mouth every 4 (four) hours as needed for moderate pain.     rOPINIRole 1 MG tablet  Commonly known as:  REQUIP  Take 1 mg by mouth 3 (three) times  daily as needed.     sertraline 100 MG tablet  Commonly known as:  ZOLOFT  Take 100 mg by mouth at bedtime. 1.5 tablets at bedtime     SPIRIVA HANDIHALER IN  Inhale 1 puff into the lungs daily as needed.     traMADol 50 MG tablet  Commonly known as:  ULTRAM  Take 1 tablet (50 mg total) by mouth every 6 (six) hours as needed.        Diagnostic Studies: Dg Hip Operative Unilat W Or W/o Pelvis Left  08/31/2015  CLINICAL DATA:  Left hip surgery. EXAM: OPERATIVE left HIP (WITH PELVIS IF PERFORMED) 2 VIEWS TECHNIQUE: Fluoroscopic spot image(s) were submitted for interpretation post-operatively. COMPARISON:  No prior. FINDINGS: Total left hip replacement with good anatomic alignment. IMPRESSION: Total left hip replacement with good anatomic alignment. Electronically Signed   By: Marcello Moores  Register   On: 08/31/2015 12:51   Dg Hip Unilat W Or W/o Pelvis 2-3 Views Left  08/31/2015  CLINICAL DATA:  Left hip arthroplasty EXAM: DG HIP (WITH OR WITHOUT PELVIS) 2-3V LEFT COMPARISON:  None. FINDINGS: Total left hip arthroplasty without periprosthetic fracture or dislocation. IMPRESSION: Left hip arthroplasty without acute finding. Electronically Signed   By: Monte Fantasia M.D.   On: 08/31/2015 14:06    Disposition: Final discharge disposition not confirmed    Follow-up Information    Follow up with [facility] SN[facility] .   Specialty:  [place] information:   [place] [phone]      Follow up with MENZ,MICHAEL, MD In 2 weeks.   Specialty:  Orthopedic Surgery   Contact information:   [address] [place] [address] [phone]        Signed: Dorise Hiss [facility] 09/03/2015, 8:26 AM

## 2015-09-03 NOTE — Progress Notes (Signed)
Patient is medically stable for D/C to [facility] today. Per [facility] admissions coordinator at [facility] patient will go to room 403. RN will call report at [phone]. Narda Rutherford is agreeable for patient to come to [facility] on a 2 week LOG including PT. [facility] social work Clinical biochemist LOG. Patient completed [facility] admissions paper work. CSW sent D/C Summary, FL2, D/C Packet, prescriptions, and completed admissions paper work to [place] today. Per patient her son Angelica Chessman will provide transport. CSW contacted patient's sister Daine Floras and left a voicemail. Please reconsult if future social work needs arise. CSW signing off.   Blima Rich, LCSW [phone]

## 2016-11-09 IMAGING — DX DG HIP (WITH OR WITHOUT PELVIS) 2-3V*L*
2 series · 2 of 2 positions shown · non-contrast
Comparison: None.

CLINICAL DATA: Left hip arthroplasty

EXAM:
DG HIP (WITH OR WITHOUT PELVIS) 2-3V LEFT

[hip ap]
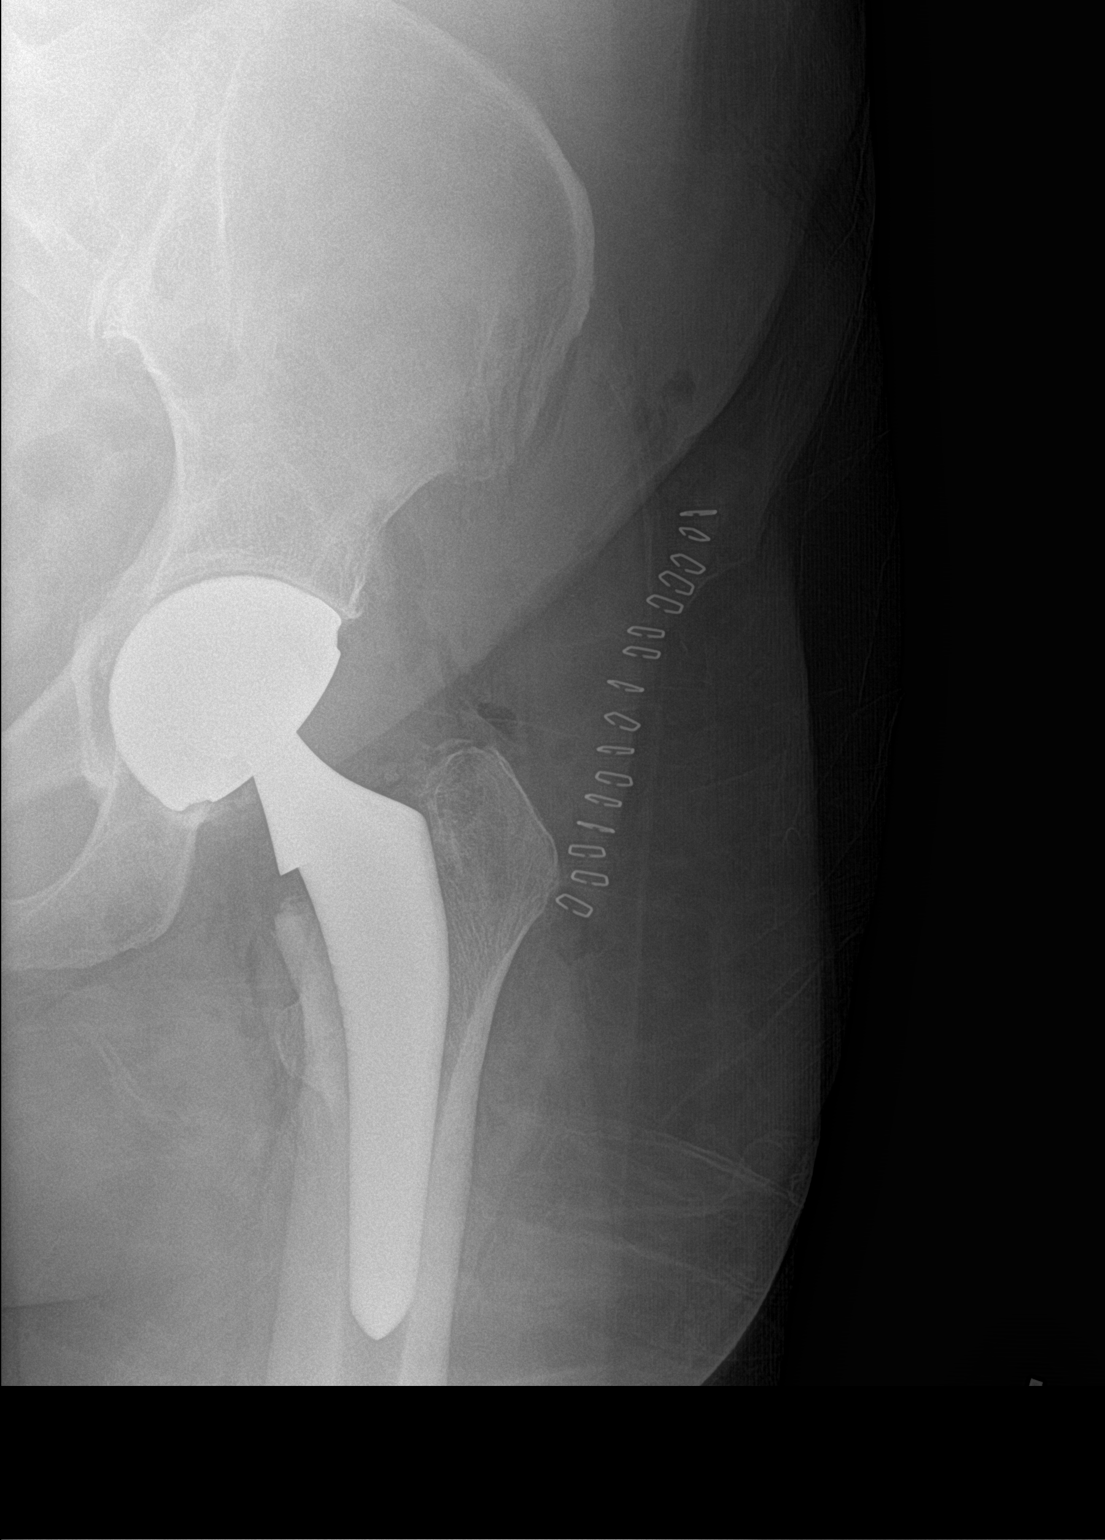

[hip lat]
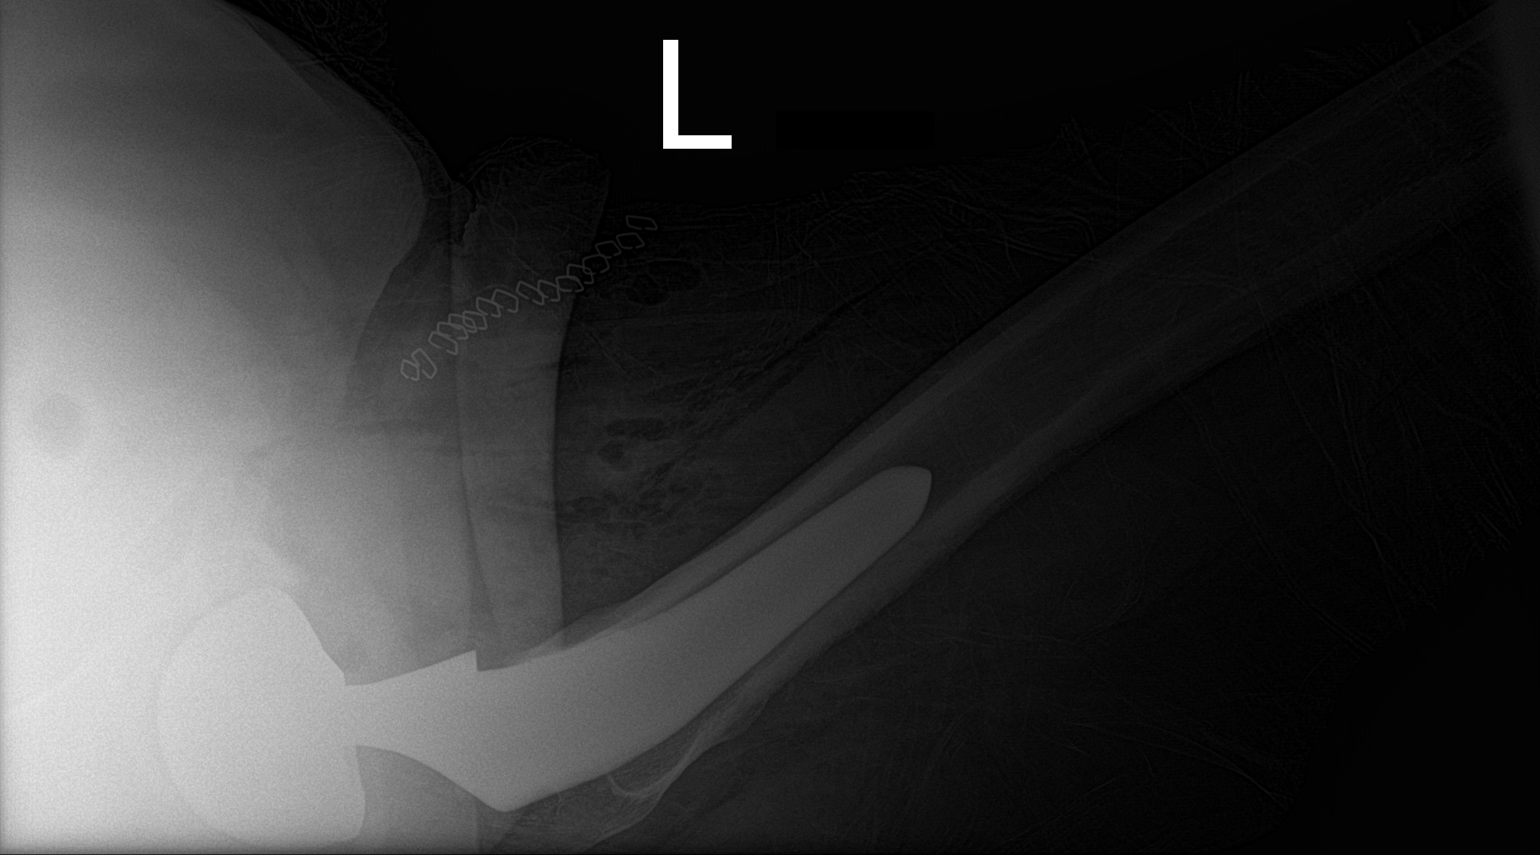

[2 of 2 positions shown; findings below may reference images not displayed]

FINDINGS: Total left hip arthroplasty without periprosthetic fracture or
dislocation.
IMPRESSION: Left hip arthroplasty without acute finding.

## 2020-10-29 ENCOUNTER — Inpatient Hospital Stay: Admit: 2020-10-29 | Discharge: 2020-10-29 | Discharge status: Against Medical Advice | Attending: Emergency Medicine

## 2020-10-29 DIAGNOSIS — R079 Chest pain, unspecified: Secondary | ICD-10-CM

## 2020-10-29 LAB — CBC WITH AUTO DIFFERENTIAL
Absolute Baso #: 0.1 10*3/uL (ref 0.0–0.2)
Absolute Eos #: 0.3 10*3/uL (ref 0.0–0.5)
Absolute Lymph #: 2 10*3/uL (ref 1.0–4.3)
Absolute Mono #: 0.6 10*3/uL (ref 0.0–0.8)
Absolute Neut #: 4.1 10*3/uL (ref 1.8–7.0)
Basophils: 1.1 % (ref 0.0–2.0)
Eosinophils: 3.8 % (ref 1.0–6.0)
Granulocytes %: 58 % (ref 40.0–80.0)
Hematocrit: 47.9 % — ABNORMAL HIGH (ref 35.0–47.0)
Hemoglobin: 16.1 g/dL — ABNORMAL HIGH (ref 11.7–16.0)
Lymphocyte %: 28.7 % (ref 20.0–40.0)
MCH: 30.6 pg (ref 26.0–34.0)
MCHC: 33.5 % (ref 32.0–36.0)
MCV: 91.3 fL (ref 79.0–98.0)
MPV: 8 fL (ref 7.4–10.4)
Monocytes: 8.4 % (ref 2.0–10.0)
Platelets: 278 10*3/uL (ref 140–440)
RBC: 5.25 10*6/uL — ABNORMAL HIGH (ref 3.80–5.20)
RDW: 13.8 % (ref 11.5–14.5)
WBC: 7 10*3/uL (ref 3.6–10.7)

## 2020-10-29 LAB — COMPREHENSIVE METABOLIC PANEL
ALT: 21 U/L (ref 0–34)
AST: 53 U/L — ABNORMAL HIGH (ref 15–46)
Albumin,Serum: 4.2 g/dL (ref 3.5–5.0)
Alkaline Phosphatase: 62 U/L (ref 38–126)
Anion Gap: 5 mmol/L (ref 3–13)
BUN: 20 mg/dL (ref 9–20)
CO2: 32 mmol/L — ABNORMAL HIGH (ref 22–30)
Calcium: 8.9 mg/dL (ref 8.4–10.4)
Chloride: 103 mmol/L (ref 98–107)
Creatinine: 0.58 mg/dL (ref 0.52–1.25)
EGFR IF NonAfrican American: >90 mL/min (ref >60)
Glucose: 108 mg/dL — ABNORMAL HIGH (ref 70–100)
Potassium: 3.6 mmol/L (ref 3.5–5.1)
Sodium: 140 mmol/L (ref 135–145)
Total Bilirubin: 0.5 mg/dL (ref 0.2–1.3)
Total Protein: 6.6 g/dL (ref 6.3–8.2)
eGFR African American: >90 mL/min (ref >60)

## 2020-10-29 LAB — TROPONIN
Troponin I: <0.012 ng/mL (ref 0.000–0.034)
Troponin I: <0.012 ng/mL (ref 0.000–0.034)

## 2020-10-29 LAB — BRAIN NATRIURETIC PEPTIDE: NT Pro-BNP: 89 pg/mL (ref 0–125)

## 2020-10-29 LAB — LIPASE: Lipase: 88 U/L (ref 23–300)

## 2020-10-29 MED ORDER — METOPROLOL TARTRATE 5 MG/5ML IV SOLN
5 | INTRAVENOUS | Status: DC | PRN
Start: 2020-10-29 — End: 2020-10-29

## 2020-10-29 MED ORDER — ASPIRIN 81 MG PO CHEW
81 MG | Freq: Once | ORAL | Status: AC
Start: 2020-10-29 — End: 2020-10-29
  Administered 2020-10-29: 15:00:00 via ORAL

## 2020-10-29 MED ORDER — NORMAL SALINE FLUSH 0.9 % IV SOLN
0.9 | INTRAVENOUS | Status: DC | PRN
Start: 2020-10-29 — End: 2020-10-29

## 2020-10-29 MED ORDER — HYDROCHLOROTHIAZIDE 25 MG PO TABS
25 MG | Freq: Every day | ORAL | Status: DC
Start: 2020-10-29 — End: 2020-10-29

## 2020-10-29 MED ORDER — NITROGLYCERIN 2 % TD OINT
2 % | Freq: Once | TRANSDERMAL | Status: AC
Start: 2020-10-29 — End: 2020-10-29
  Administered 2020-10-29: 15:00:00 via TOPICAL

## 2020-10-29 MED ORDER — ONDANSETRON HCL 4 MG/2ML IJ SOLN
4 MG/2ML | Freq: Once | INTRAMUSCULAR | Status: AC
Start: 2020-10-29 — End: 2020-10-29
  Administered 2020-10-29: 15:00:00 via INTRAVENOUS

## 2020-10-29 MED ORDER — METHYLPREDNISOLONE SODIUM SUCC 125 MG IJ SOLR
125 MG | Freq: Once | INTRAMUSCULAR | Status: AC
Start: 2020-10-29 — End: 2020-10-29
  Administered 2020-10-29: 15:00:00 via INTRAVENOUS

## 2020-10-29 MED ORDER — SODIUM CHLORIDE 0.9 % IV SOLN
0.9 | INTRAVENOUS | Status: DC | PRN
Start: 2020-10-29 — End: 2020-10-29

## 2020-10-29 MED ORDER — METHYLPREDNISOLONE SODIUM SUCC 125 MG IJ SOLR
125 MG | Freq: Every day | INTRAMUSCULAR | Status: DC
Start: 2020-10-29 — End: 2020-10-29

## 2020-10-29 MED ORDER — PREDNISONE 50 MG PO TABS
50 MG | ORAL_TABLET | Freq: Every day | ORAL | 0 refills | Status: AC
Start: 2020-10-29 — End: 2020-11-03

## 2020-10-29 MED ORDER — ONDANSETRON HCL 4 MG/2ML IJ SOLN
4 MG/2ML | Freq: Four times a day (QID) | INTRAMUSCULAR | Status: DC | PRN
Start: 2020-10-29 — End: 2020-10-29

## 2020-10-29 MED ORDER — IPRATROPIUM-ALBUTEROL 0.5-2.5 (3) MG/3ML IN SOLN
RESPIRATORY_TRACT | Status: DC
Start: 2020-10-29 — End: 2020-10-29
  Administered 2020-10-29: 20:00:00 via RESPIRATORY_TRACT

## 2020-10-29 MED ORDER — AMINOPHYLLINE 25 MG/ML IV SOLN
25 MG/ML | INTRAVENOUS | Status: DC | PRN
Start: 2020-10-29 — End: 2020-10-29

## 2020-10-29 MED ORDER — ONDANSETRON 4 MG PO TBDP
4 MG | Freq: Three times a day (TID) | ORAL | Status: DC | PRN
Start: 2020-10-29 — End: 2020-10-29

## 2020-10-29 MED ORDER — ALBUTEROL SULFATE (2.5 MG/3ML) 0.083% IN NEBU
Freq: Once | RESPIRATORY_TRACT | Status: AC
Start: 2020-10-29 — End: 2020-10-29
  Administered 2020-10-29: 15:00:00 via RESPIRATORY_TRACT

## 2020-10-29 MED ORDER — NITROGLYCERIN 0.4 MG SL SUBL
0.4 | SUBLINGUAL | Status: DC | PRN
Start: 2020-10-29 — End: 2020-10-29

## 2020-10-29 MED ORDER — ATROPINE SULFATE 1 MG/10ML IJ SOSY
1 MG/0ML | INTRAMUSCULAR | Status: DC | PRN
Start: 2020-10-29 — End: 2020-10-29

## 2020-10-29 MED ORDER — SERTRALINE HCL 100 MG PO TABS
100 MG | Freq: Every day | ORAL | Status: DC
Start: 2020-10-29 — End: 2020-10-29

## 2020-10-29 MED ORDER — ACETAMINOPHEN 325 MG PO TABS
325 MG | Freq: Four times a day (QID) | ORAL | Status: DC | PRN
Start: 2020-10-29 — End: 2020-10-29

## 2020-10-29 MED ORDER — ROPINIROLE HCL 2 MG PO TABS
2 MG | Freq: Every evening | ORAL | Status: DC
Start: 2020-10-29 — End: 2020-10-29

## 2020-10-29 MED ORDER — ALBUTEROL SULFATE HFA 108 (90 BASE) MCG/ACT IN AERS
108 (90 Base) MCG/ACT | Freq: Four times a day (QID) | RESPIRATORY_TRACT | Status: DC | PRN
Start: 2020-10-29 — End: 2020-10-29
  Administered 2020-10-29: 23:00:00 via RESPIRATORY_TRACT

## 2020-10-29 MED ORDER — ASPIRIN 81 MG PO CHEW
81 MG | Freq: Every day | ORAL | Status: DC
Start: 2020-10-29 — End: 2020-10-29

## 2020-10-29 MED ORDER — SODIUM CHLORIDE 0.9 % IV BOLUS
0.9 % | Freq: Once | INTRAVENOUS | Status: AC
Start: 2020-10-29 — End: 2020-10-29
  Administered 2020-10-29: 15:00:00 via INTRAVENOUS

## 2020-10-29 MED ORDER — ENOXAPARIN SODIUM 40 MG/0.4ML IJ SOSY
40 MG/0.4ML | Freq: Every day | INTRAMUSCULAR | Status: DC
Start: 2020-10-29 — End: 2020-10-29

## 2020-10-29 MED ORDER — ACETAMINOPHEN 500 MG PO TABS
500 MG | Freq: Once | ORAL | Status: AC
Start: 2020-10-29 — End: 2020-10-29
  Administered 2020-10-29: 15:00:00 via ORAL

## 2020-10-29 MED ORDER — REGADENOSON 0.4 MG/5ML IV SOLN
0.4 | Freq: Once | INTRAVENOUS | Status: DC | PRN
Start: 2020-10-29 — End: 2020-10-29

## 2020-10-29 MED ORDER — ALBUTEROL SULFATE HFA 108 (90 BASE) MCG/ACT IN AERS
108 (90 Base) MCG/ACT | RESPIRATORY_TRACT | Status: DC | PRN
Start: 2020-10-29 — End: 2020-10-29

## 2020-10-29 MED ORDER — ACETAMINOPHEN 650 MG RE SUPP
650 MG | Freq: Four times a day (QID) | RECTAL | Status: DC | PRN
Start: 2020-10-29 — End: 2020-10-29

## 2020-10-29 MED ORDER — SODIUM CHLORIDE 0.9 % IV SOLN
0.9 % | INTRAVENOUS | Status: DC | PRN
Start: 2020-10-29 — End: 2020-10-29

## 2020-10-29 MED ORDER — ATORVASTATIN CALCIUM 40 MG PO TABS
40 MG | Freq: Every evening | ORAL | Status: DC
Start: 2020-10-29 — End: 2020-10-29

## 2020-10-29 MED ORDER — TRAMADOL HCL 50 MG PO TABS
50 MG | Freq: Four times a day (QID) | ORAL | Status: DC | PRN
Start: 2020-10-29 — End: 2020-10-29

## 2020-10-29 MED ORDER — POLYETHYLENE GLYCOL 3350 17 G PO PACK
17 g | Freq: Every day | ORAL | Status: DC | PRN
Start: 2020-10-29 — End: 2020-10-29

## 2020-10-29 MED ORDER — TIOTROPIUM BROMIDE MONOHYDRATE 18 MCG IN CAPS
18 MCG | Freq: Every day | RESPIRATORY_TRACT | Status: DC
Start: 2020-10-29 — End: 2020-10-29

## 2020-10-29 MED ORDER — NORMAL SALINE FLUSH 0.9 % IV SOLN
0.9 | Freq: Two times a day (BID) | INTRAVENOUS | Status: DC
Start: 2020-10-29 — End: 2020-10-29

## 2020-10-29 MED FILL — HYDROCHLOROTHIAZIDE 25 MG PO TABS: 25 mg | ORAL | Qty: 1

## 2020-10-29 MED FILL — VENTOLIN HFA 108 (90 BASE) MCG/ACT IN AERS: 108 (90 Base) MCG/ACT | RESPIRATORY_TRACT | Qty: 8

## 2020-10-29 MED FILL — TYLENOL EXTRA STRENGTH 500 MG PO TABS: 500 mg | ORAL | Qty: 2

## 2020-10-29 MED FILL — ALBUTEROL SULFATE (2.5 MG/3ML) 0.083% IN NEBU: RESPIRATORY_TRACT | Qty: 3

## 2020-10-29 MED FILL — ROPINIROLE HCL 1 MG PO TABS: 1 mg | ORAL | Qty: 1

## 2020-10-29 MED FILL — LOVENOX 40 MG/0.4ML IJ SOSY: 40 MG/0.4ML | INTRAMUSCULAR | Qty: 0.4

## 2020-10-29 MED FILL — LEXISCAN 0.4 MG/5ML IV SOLN: 0.4 MG/5ML | INTRAVENOUS | Qty: 5

## 2020-10-29 MED FILL — SOLU-MEDROL 125 MG IJ SOLR: 125 mg | INTRAMUSCULAR | Qty: 125

## 2020-10-29 MED FILL — SPIRIVA HANDIHALER 18 MCG IN CAPS: 18 ug | RESPIRATORY_TRACT | Qty: 1

## 2020-10-29 MED FILL — ONDANSETRON HCL 4 MG/2ML IJ SOLN: 4 MG/2ML | INTRAMUSCULAR | Qty: 2

## 2020-10-29 MED FILL — SERTRALINE HCL 100 MG PO TABS: 100 mg | ORAL | Qty: 2

## 2020-10-29 MED FILL — ASPIRIN 81 MG PO CHEW: 81 mg | ORAL | Qty: 4

## 2020-10-29 MED FILL — ATORVASTATIN CALCIUM 40 MG PO TABS: 40 mg | ORAL | Qty: 1

## 2020-10-29 MED FILL — NITRO-BID 2 % TD OINT: 2 % | TRANSDERMAL | Qty: 1

## 2020-10-29 NOTE — Discharge Instructions (Signed)
As tolerated

## 2020-10-29 NOTE — Plan of Care (Signed)
Spoke with RN after stress order was placed. Informed him that test would done Saturday (4/23) in the AM. Gave prep instructions and stated that the other Troponins would need to result. RN informed me that patient may leave AMA. I asked that he message me via [facility] if this were to occur before morning. RN took down my name and agreed.

## 2020-10-29 NOTE — Other (Unsigned)
Patient Acct Nbr: [id number]   Primary AUTH/CERT: ""  Primary Insurance Company Name: Self Pay  Primary Insurance Plan name: Self Pay  Primary Insurance Group Number:   Primary Insurance Plan Type: Health  Primary Insurance Policy Number: [id number]

## 2020-10-29 NOTE — Discharge Summary (Signed)
Clinical Decision Unit Discharge Note  DC 10/29/20 AMA at 1845    Date and time patient placed in observation status: 10/29/20 at 11:30am    Reason patient placed into observation status: Chest pain    Observation Care disposition was supervised by Dr. Rebecka Apley Course:      Patient is not agreeable with hospitalization.  She did agree to stay for repeat troponin which was negative.  Ambulating on room air at 88 to 89%.  Patient is a tobacco smoker.  She does not wear home oxygen.  She had home oxygen evaluation and requires 2 L and declines hospitalization for further evaluation and obtaining home oxygen.  She has no increased redness of breath or cough.  Plan to discharge home with albuterol inhaler that she is given in the emergency department as her home inhaler is getting low.  She is given prescription for 5-day course of prednisone for COPD exacerbation.  Patient agrees to return to the hospital if she is agreeable with hospitalization.  We did discuss she is at risk for death, disability and she still wishes to leave [place].  She does agree to follow-up with PCP next week and return with any new or worsening symptoms.    The patient was informed that although the workup on this visit is negative, they still need close outpatient follow up with their Primary Care Physician for continued and further evaluation.  The patient was also told that should new, worsening, or changing symptoms develop, they need to return to the ED for evaluation.  The patient verbalizes understanding of our discussion and agrees with the discussed plan.      Discharge Diagnosis: Left AMA   Chest pain, unspecified  History of COPD    Focused Physical Exam:  Nursing Notes Reviewed  General: Vitals noted.  NAD, nontoxic, afebrile.  HEENT: Normocephalic, atraumatic, sclerae clear, moist mucous membranes, neck supple  Cardiac: Regular rate and rhythm, no murmurs  Lungs: Scattered coarse lung sounds   abdomen:  Soft, nontender, normoactive bowel sounds, no rebound or guarding, no rigidity  Extremities: No edema, calves are supple and nontender  Skin: Warm and dry  Neuro: Alert and oriented ??3, ambulating without difficulty   psych: Cooperative, normal mood and affect      VITAL SIGNS: BP 105/61    Pulse 77    Temp 98.4 ??F (36.9 ??C) (Temporal)    Resp 17    Ht '5\' 9"'  (1.753 m)    Wt 160 lb (72.6 kg)    SpO2 (!) 88%    BMI 23.63 kg/m??        Labs:  Results for orders placed or performed during the hospital encounter of 10/29/20   CBC with Auto Differential   Result Value Ref Range    WBC 7.0 3.6 - 10.7 10*3/uL    RBC 5.25 (H) 3.80 - 5.20 10*6/uL    Hemoglobin 16.1 (H) 11.7 - 16.0 g/dL    Hematocrit 47.9 (H) 35.0 - 47.0 %    MCV 91.3 79.0 - 98.0 fL    MCH 30.6 26.0 - 34.0 pg    MCHC 33.5 32.0 - 36.0 %    RDW 13.8 11.5 - 14.5 %    Platelets 278 140 - 440 10*3/uL    MPV 8.0 7.4 - 10.4 fL    Granulocytes % 58.0 40.0 - 80.0 %    Lymphocyte % 28.7 20.0 - 40.0 %    Monocytes 8.4 2.0 -  10.0 %    Eosinophils 3.8 1.0 - 6.0 %    Basophils 1.1 0.0 - 2.0 %    Absolute Neut # 4.1 1.8 - 7.0 10*3/uL    Absolute Lymph # 2.0 1.0 - 4.3 10*3/uL    Absolute Mono # 0.6 0.0 - 0.8 10*3/uL    Absolute Eos # 0.3 0.0 - 0.5 10*3/uL    Absolute Baso # 0.1 0.0 - 0.2 10*3/uL   Comprehensive Metabolic Panel   Result Value Ref Range    Sodium 140 135 - 145 mmol/L    Potassium 3.6 3.5 - 5.1 mmol/L    Chloride 103 98 - 107 mmol/L    CO2 32 (H) 22 - 30 mmol/L    Anion Gap 5 3 - 13 mmol/L    Glucose 108 (H) 70 - 100 mg/dL    BUN 20 9 - 20 mg/dL    CREATININE 0.58 0.52 - 1.25 mg/dL    eGFR African American >90.0 >60 mL/min    EGFR IF NonAfrican American >90.0 >60 mL/min    Calcium 8.9 8.4 - 10.4 mg/dL    Albumin,Serum 4.2 3.5 - 5.0 g/dL    Total Protein 6.6 6.3 - 8.2 g/dL    Total Bilirubin 0.5 0.2 - 1.3 mg/dL    Alkaline Phosphatase 62 38 - 126 U/L    ALT 21 0 - 34 U/L    AST 53 (H) 15 - 46 U/L   Lipase   Result Value Ref Range    Lipase 88 23 - 300 U/L    Troponin   Result Value Ref Range    Troponin I <0.012 0.000 - 0.034 ng/mL   Brain Natriuretic Peptide   Result Value Ref Range    NT Pro-BNP 89 0 - 125 pg/mL   Troponin   Result Value Ref Range    Troponin I <0.012 0.000 - 0.034 ng/mL       Radiographs:  Radiologist's Report Reviewed:  XR CHEST PORTABLE    Result Date: 10/29/2020  Patient Name:                Janet Vasquez, Janet Vasquez MRN:                         [id number] Acct#:                       [id number] Diagnostic Radiology ACCESSION                 EXAM [place] [id number]             10/29/2020 11:09 EDT      CR Chest Portable         GALLO, MD, DOUGALAS R. CPT code [phone] Reason For Exam (CR Chest Portable) chest pain, wheeze, copd hx. mild leg swelling Report PORTABLE CHEST: INDICATION: Chest pain, wheezing COMPARISON:  No previous studies are available for comparison. Obtained at 1104 hours. A single portable AP radiograph of the chest was obtained.  The heart is normal in size.  The mediastinal silhouette is normal.  Chronic interstitial changes are present.  There are no effusions or acute infiltrates. There is no pleural thickening.  Arthritic changes of the spine and shoulders are present. IMPRESSION:  Chronic changes.  No acute process. Report  Dictated on Workstation: [id number] ---** Final ---** Dictating Physician: Roselee Nova, ALFRED Signed Date and Time: 10/29/2020 11:17 am Signed by: Roselee Nova, ALFRED Transcribed Date and Time: 10/29/2020 11:18      EKG Interpretation: Refer to [facility] or ED attending documentation.      Any outstanding studies: Stress test, repeat troponins    Disposition referral/followup:  Lorane Gell, MD  [address]  [place] [address]  [phone]      Call Monday to schedule appointment    [facility] ED  [address]  [place] [address]  [phone]    If you change your mind and agree to admission      Disposition medications:  Current  Discharge Medication List      START taking these medications    Details   predniSONE (DELTASONE) 50 MG tablet Take 1 tablet by mouth daily for 5 days  Qty: 5 tablet, Refills: 0               Comment: Please note this report has been produced using speech recognition software and may contain errors related to that system including errors in grammar, punctuation, and spelling, as well as words and phrases that may be inappropriate.  If there is any questions or concerns please feel free to contact the dictating provider for clarification.

## 2020-10-29 NOTE — ED Provider Notes (Signed)
Emergency Department Encounter  [facility]    Patient: Janet Vasquez  MRN: [id number]  DOB: 1956-04-23  Date of Evaluation: 10/29/2020  ED Provider: Jeryl Columbia, MD    Chief Complaint       Chief Complaint   Patient presents with   ??? Chest Pain     HOPI     Janet Vasquez is a 65 y.o. female who presents to the emergency department complaining of chest pain shortness of breath    Patient says she had indigestion 2 nights ago but it disappeared on its own.  She has never been diagnosed with GERD or pancreatitis.  She had an EGD and a colonoscopy about 5 months ago both of which were negative.  She then started having shortness of breath last night which progressed to chest pain and shortness of breath this morning.  Mild nausea.  Chest pain is substernal and pressure sensation.  Does not radiate anywhere.  Endorses a chronic cough but this is no different than normal.  No hemoptysis.  No fevers no chills.  No recent travel no prolonged immobility no COVID-19 symptoms, no sick contacts.  No syncope no presyncope no abdominal pain no vomiting no diarrhea no hematochezia no melena no orthopnea no paroxysmal nocturnal dyspnea.  Does have mild bilateral lower extremity edema which is new for her.  No dysuria urgency frequency or hematuria.  There is a family history of heart disease.  She is an active smoker.  History of hypertension but no hypercholesterolemia or diabetes.  History of COPD but never diagnosed with coronary artery disease.  There is a family history of coronary artery disease in her mother at about the age that she is now, early 77s.  Patient denies any syncope or presyncope, no headache no neck pain no back pain no "ripping or tearing" sensation, she is not tachycardic or hypoxic.  She does have some mild wheezing but says this is not unusual given her baseline COPD status.  She does not require any supplemental oxygen.  She is euthymic, no fevers no chills no strange rashes.    ROS:     Complete  review of systems performed, and otherwise acutely negative except as in the HPI    Past History   History reviewed. No pertinent past medical history.  History reviewed. No pertinent surgical history.  Social History     Socioeconomic History   ??? Marital status: Divorced     Spouse name: None   ??? Number of children: None   ??? Years of education: None   ??? Highest education level: None   Occupational History   ??? None   Tobacco Use   ??? Smoking status: Current Every Day Smoker     Packs/day: 0.50     Types: Cigarettes   ??? Smokeless tobacco: Never Used   Substance and Sexual Activity   ??? Alcohol use: Not Currently   ??? Drug use: None   ??? Sexual activity: None   Other Topics Concern   ??? None   Social History Narrative   ??? None     Social Determinants of Health     Financial Resource Strain:    ??? Difficulty of Paying Living Expenses: Not on file   Food Insecurity:    ??? Worried About Charity fundraiser in the Last Year: Not on file   ??? Ran Out of Food in the Last Year: Not on file   Transportation Needs:    ???  Lack of Transportation (Medical): Not on file   ??? Lack of Transportation (Non-Medical): Not on file   Physical Activity:    ??? Days of Exercise per Week: Not on file   ??? Minutes of Exercise per Session: Not on file   Stress:    ??? Feeling of Stress : Not on file   Social Connections:    ??? Frequency of Communication with Friends and Family: Not on file   ??? Frequency of Social Gatherings with Friends and Family: Not on file   ??? Attends Religious Services: Not on file   ??? Active Member of Clubs or Organizations: Not on file   ??? Attends Archivist Meetings: Not on file   ??? Marital Status: Not on file   Intimate Partner Violence:    ??? Fear of Current or Ex-Partner: Not on file   ??? Emotionally Abused: Not on file   ??? Physically Abused: Not on file   ??? Sexually Abused: Not on file   Housing Stability:    ??? Unable to Pay for Housing in the Last Year: Not on file   ??? Number of Places Lived in the Last Year: Not on file    ??? Unstable Housing in the Last Year: Not on file       Medications/Allergies     Previous Medications    No medications on file     No Known Allergies     Physical Exam       ED Triage Vitals   BP Temp Temp src Pulse Resp SpO2 Height Weight   -- -- -- -- -- -- -- --     General: WDWN adult in NAD. Non-toxic appearing    HENT: Head NCAT, EOMI with no erythema, swelling or D/C Oropharyngeal mucus membranes moist, pink, no exudate    Neck: Full ROM, supple, no rigidity    Cardio: RRR, nl s1 s2 no m/r/g, extremities warm, dry, well perfused, 1+ symmetrical bilateral lower extremity nonpitting edema    Lungs: Mild end expiratory wheezing audible in bilateral apices and bilateral middle lobes.  Diminished lung sounds bilateral lower lobes.  No cyanosis no retractions.  SaO2 98 to 100% on room air.    Abdomen: Soft, NT, ND, non-rigid, BS x 4 normal, no flank pain to palpation, no CVA tenderness bilaterally    Skin: Warm, dry, pink, no rashes, bruising, or lacerations, no petechiae, no purpura    Neuro: Alert, oriented, mentating normally       Diagnostics   Labs:  Labs Reviewed   CBC WITH AUTO DIFFERENTIAL - Abnormal; Notable for the following components:       Result Value    RBC 5.25 (*)     Hemoglobin 16.1 (*)     Hematocrit 47.9 (*)     All other components within normal limits    Narrative:     Test Performed by [facility], [address] , [place], [place] [address]   COMPREHENSIVE METABOLIC PANEL - Abnormal; Notable for the following components:    CO2 32 (*)     Glucose 108 (*)     AST 53 (*)     All other components within normal limits    Narrative:     Test Performed by [facility], [address] , [place], [place] [address]   LIPASE    Narrative:     Test Performed by [facility], [address] , [place],  [address]   TROPONIN  Narrative:     Test Performed by [facility], [address] , [place], [place] [address]   BRAIN NATRIURETIC PEPTIDE    Narrative:     Test  Performed by [facility], [address] , [place], [place] [address]       All ED labs reviewed.  Significant findings listed below:  Metabolic panel shows no metabolic acidosis.  Patient actually has a metabolic alkalosis instead.  Good kidney function.  Glucose within normal limits.  AST minimally elevated, ALT unremarkable  Alk phos unremarkable  Lipase unremarkable.  Pancreatitis as an explanation for the chest tightness appears unlikely  Normal blood cell count  Hemoconcentrated, likely dehydrated.  We hydrated patient with normal saline IV fluids 500 mL at this time  Troponin #1 negative  BNP negative        Radiographs:  XR CHEST PORTABLE    Result Date: 10/29/2020  Patient Name:                Janet Vasquez, Janet Vasquez MRN:                         [id number] Acct#:                       [id number] [place] [phone]             10/29/2020 11:09 EDT      CR Chest Portable         Janet Licciardi, MD, DOUGALAS R. CPT code [phone] Reason For Exam (CR Chest Portable) chest pain, wheeze, copd hx. mild leg swelling Report PORTABLE CHEST: INDICATION: Chest pain, wheezing COMPARISON:  No previous studies are available for comparison. Obtained at 1104 hours. A single portable AP radiograph of the chest was obtained.  The heart is normal in size.  The mediastinal silhouette is normal.  Chronic interstitial changes are present.  There are no effusions or acute infiltrates. There is no pleural thickening.  Arthritic changes of the spine and shoulders are present. IMPRESSION:  Chronic changes.  No acute process. Report Dictated on Workstation: [id number] ---** Final ---** Dictating Physician: Roselee Nova, ALFRED Signed Date and Time: 10/29/2020 11:17 am Signed by: Roselee Nova, ALFRED Transcribed Date and Time: 10/29/2020 11:18      All ED imaging reviewed.  Significant findings listed below:  Chest x-ray shows chronic changes  and no acute process.  No effusions or acute infiltrates    Procedures/EKG:   I read and interpreted this EKG.  My interpretation can be found in the [facility] EKG system.    ED Course and MDM   In brief, Malvika Tung is a 65 y.o. female who presented to the emergency department with chest pain shortness of breath    The patient's COPD component of her chest pain and dyspnea appears very minimal.  We will treat her with methylprednisolone and albuterol inhaler, but I do not feel that the COPD is driving the majority of her complaints.  Patient says that this is her baseline status as far as her COPD is concerned.  She has a benign abdominal examination and a negative EGD and negative colonoscopy, so gastritis or pancreatitis are  less likely.  Therefore we will assume CAD until proven otherwise.  We will give patient 324 mg of aspirin, nitroglycerin ointment since the chest pain is gone at this time, she would be an excellent candidate for the CDU provider that her first troponin is negative.  If her first troponin is negative, her HEART score is 4.    Patient is chest pain-free at this time, no wheezing following nebulizer, SaO2 88 to 92% on room air, within normal limits for the COPD patient.  In order to further diagnose whether or not the patient has coronary artery disease, CDU was paged for admission    CDU accepted patient, patient agrees to CDU admission for further diagnostics as to etiology of chest pain    Disposition: CDU admission to [facility]    Clinical impression: Chest pain, history of COPD, dehydration    FINAL IMPRESSION      1. Chest pain, unspecified type    2. History of COPD    3. Dehydration          DISPOSITION/PLAN   DISPOSITION        Comment: Please note this report has been produced using speech recognition software and may contain errors related to that system including errors in grammar, punctuation, and spelling, as well as words and phrases that may be inappropriate.  If there are any  questions or concerns please feel free to contact the dictating provider for clarification.    Jeryl Columbia, MD  [employer]        Kell Ferris R Graeme Menees, MD  10/29/20 [phone]

## 2020-10-29 NOTE — H&P (Signed)
Date of Service: 10/29/20     Clinical Decision Unit History & Physical Exam    []  Treatment statement/reason for placement in observation:  Emergency Department treatment has not successfully improved the patient's condition for discharge and tranfer to the observation unit is necessary for ongoing treatment and stabilization requiring reassessment for discharge or hospital admission.    [x]  Evaluation statement:  Emergency Department evaluation of the patient's symptoms and complaint have determined a need for ongoing evaluation in the observation unit to undergo further reassessment and testing to determine if those symptoms and complaints worsen and require hospital admission or result in determination for safe discharge.    Patient Name: Janet KeensBarbara Vasquez  CDU Admit Date:  10/29/20 at 1130  Primary Care Physician: No primary care provider on file.  CDU Admitting Diagnoses:    1. Chest pain, unspecified type    2. History of COPD    3. Dehydration          HPI:  In brief, patient presented to the ED with chief complaint of substernal chest pressure that lasted 30 minutes, nothing made better or worse.  Review of electronic record reveals patient had a diagnosis of syncope and patient denies any episodes of syncope or lightheadedness.  Patient always has cough and shortness of breath with history of COPD.  Tobacco smoker.  Denies any fevers, chills and denies any PE risk factors.  No nausea or vomiting.  No chest pain here.  Was seen in the emergency department and evaluated for chest pain and found to have oxygen saturation 89% requiring 1 L nasal cannula.  Patient transferred here for cardiac evaluation and she does not want to be admitted overnight and needs to go home.  She is agreeable with repeating 1 troponin.  She is chest pain-free and no increase shortness of breath or cough.    REVIEW OF SYSTEMS    A full 12-point review of systems is negative except as stated in above HPI.    See HPI and nursing notes for  additional information.     Past Medical & Social History  Past Medical History:   Diagnosis Date   ??? Anxiety    ??? COPD (chronic obstructive pulmonary disease) (HCC)    ??? Hypertension    ??? Restless legs      Past Surgical History:   Procedure Laterality Date   ??? CHOLECYSTECTOMY     ??? JOINT REPLACEMENT Left     Hip   ??? KNEE SURGERY Left 2009   ??? ROTATOR CUFF REPAIR Left    ??? TONSILLECTOMY       Family History   Problem Relation Age of Onset   ??? Cancer Mother    ??? Cancer Father      Social History     Socioeconomic History   ??? Marital status: Divorced     Spouse name: Not on file   ??? Number of children: Not on file   ??? Years of education: Not on file   ??? Highest education level: Not on file   Occupational History   ??? Not on file   Tobacco Use   ??? Smoking status: Current Every Day Smoker     Packs/day: 0.50     Types: Cigarettes   ??? Smokeless tobacco: Never Used   Substance and Sexual Activity   ??? Alcohol use: Not Currently   ??? Drug use: Not on file   ??? Sexual activity: Not on file   Other Topics Concern   ???  Not on file   Social History Narrative   ??? Not on file     Social Determinants of Health     Financial Resource Strain:    ??? Difficulty of Paying Living Expenses: Not on file   Food Insecurity:    ??? Worried About Running Out of Food in the Last Year: Not on file   ??? Ran Out of Food in the Last Year: Not on file   Transportation Needs:    ??? Lack of Transportation (Medical): Not on file   ??? Lack of Transportation (Non-Medical): Not on file   Physical Activity:    ??? Days of Exercise per Week: Not on file   ??? Minutes of Exercise per Session: Not on file   Stress:    ??? Feeling of Stress : Not on file   Social Connections:    ??? Frequency of Communication with Friends and Family: Not on file   ??? Frequency of Social Gatherings with Friends and Family: Not on file   ??? Attends Religious Services: Not on file   ??? Active Member of Clubs or Organizations: Not on file   ??? Attends Banker Meetings: Not on file   ???  Marital Status: Not on file   Intimate Partner Violence:    ??? Fear of Current or Ex-Partner: Not on file   ??? Emotionally Abused: Not on file   ??? Physically Abused: Not on file   ??? Sexually Abused: Not on file   Housing Stability:    ??? Unable to Pay for Housing in the Last Year: Not on file   ??? Number of Places Lived in the Last Year: Not on file   ??? Unstable Housing in the Last Year: Not on file     Current Facility-Administered Medications   Medication Dose Route Frequency Provider Last Rate Last Admin   ??? hydroCHLOROthiazide (HYDRODIURIL) tablet 25 mg  25 mg Oral Daily Murtis Sink, APRN - CNP       ??? rOPINIRole (REQUIP) tablet 3 mg  3 mg Oral Nightly Murtis Sink, APRN - CNP       ??? sertraline (ZOLOFT) tablet 200 mg  200 mg Oral Daily Murtis Sink, APRN - CNP       ??? tiotropium [facility]) inhalation capsule 18 mcg  18 mcg Inhalation Daily Murtis Sink, APRN - CNP       ??? traMADol (ULTRAM) tablet 50 mg  50 mg Oral Q6H PRN Murtis Sink, APRN - CNP       ??? sodium chloride flush 0.9 % injection 5-40 mL  5-40 mL IntraVENous 2 times per day Murtis Sink, APRN - CNP       ??? sodium chloride flush 0.9 % injection 5-40 mL  5-40 mL IntraVENous PRN Murtis Sink, APRN - CNP       ??? 0.9 % sodium chloride infusion  25 mL IntraVENous PRN Murtis Sink, APRN - CNP       ??? ondansetron (ZOFRAN-ODT) disintegrating tablet 4 mg  4 mg Oral Q8H PRN Murtis Sink, APRN - CNP        Or   ??? ondansetron (ZOFRAN) injection 4 mg  4 mg IntraVENous Q6H PRN Murtis Sink, APRN - CNP       ??? acetaminophen (TYLENOL) tablet 650 mg  650 mg Oral Q6H PRN Murtis Sink, APRN - CNP        Or   ??? acetaminophen (TYLENOL) suppository 650 mg  650 mg Rectal Q6H PRN Sharyl Nimrod  Roxan Hockey, APRN - CNP       ??? polyethylene glycol (GLYCOLAX) packet 17 g  17 g Oral Daily PRN Murtis Sink, APRN - CNP       ??? [START ON 10/30/2020] aspirin chewable tablet 81 mg  81 mg Oral Daily Murtis Sink, APRN - CNP       ???  atorvastatin (LIPITOR) tablet 40 mg  40 mg Oral Nightly Murtis Sink, APRN - CNP       ??? nitroGLYCERIN (NITROSTAT) SL tablet 0.4 mg  0.4 mg SubLINGual Q5 Min PRN Murtis Sink, APRN - CNP       ??? regadenoson (LEXISCAN) injection 0.4 mg  0.4 mg IntraVENous ONCE PRN Murtis Sink, APRN - CNP       ??? sodium chloride flush 0.9 % injection 5-40 mL  5-40 mL IntraVENous PRN Murtis Sink, APRN - CNP       ??? 0.9 % sodium chloride infusion  500 mL IntraVENous Continuous PRN Murtis Sink, APRN - CNP       ??? albuterol sulfate HFA 108 (90 Base) MCG/ACT inhaler 2 puff  2 puff Inhalation PRN Murtis Sink, APRN - CNP       ??? atropine injection 0.5 mg  0.5 mg IntraVENous Q5 Min PRN Murtis Sink, APRN - CNP       ??? nitroGLYCERIN (NITROSTAT) SL tablet 0.4 mg  0.4 mg SubLINGual Q5 Min PRN Murtis Sink, APRN - CNP       ??? metoprolol (LOPRESSOR) injection 5 mg  5 mg IntraVENous Q5 Min PRN Murtis Sink, APRN - CNP       ??? aminophylline injection 50 mg  50 mg IntraVENous PRN Murtis Sink, APRN - CNP       ??? enoxaparin (LOVENOX) injection 40 mg  40 mg SubCUTAneous Daily Murtis Sink, APRN - CNP       ??? ipratropium-albuterol (DUONEB) nebulizer solution 1 ampule  1 ampule Inhalation Q4H WA Murtis Sink, APRN - CNP   1 ampule at 10/29/20 1555   ??? methylPREDNISolone sodium (SOLU-MEDROL) injection 60 mg  60 mg IntraVENous Daily Murtis Sink, APRN - CNP       ??? albuterol sulfate HFA 108 (90 Base) MCG/ACT inhaler 2 puff  2 puff Inhalation Q6H PRN Dorene Grebe, APRN - CNP         No Known Allergies      Physical Exam:  Nursing Notes Reviewed  PHYSICAL EXAM    VITAL SIGNS: BP 105/61    Pulse 77    Temp 98.4 ??F (36.9 ??C) (Temporal)    Resp 17    Ht 5\' 9"  (1.753 m)    Wt 160 lb (72.6 kg)    SpO2 (!) 88%    BMI 23.63 kg/m??      Physical Exam   General: Vitals noted.  NAD, nontoxic, afebrile.  HEENT: Normocephalic, atraumatic, sclerae clear, moist mucous membranes, neck  supple  Cardiac: Regular rate and rhythm, no murmurs  Lungs: Scattered coarse lung sounds   abdomen: Soft, nontender, normoactive bowel sounds, no rebound or guarding, no rigidity  Extremities: No edema, calves are supple and nontender  Skin: Warm and dry  Neuro: Alert and oriented ??3, ambulating without difficulty  Psych: Cooperative, normal mood and affect      LABS:  Labs Reviewed   CBC WITH AUTO DIFFERENTIAL - Abnormal; Notable for the following components:       Result Value    RBC 5.25 (*)     Hemoglobin 16.1 (*)  Hematocrit 47.9 (*)     All other components within normal limits    Narrative:     Test Performed by [facility], [address] , [place], [place] [address]   COMPREHENSIVE METABOLIC PANEL - Abnormal; Notable for the following components:    CO2 32 (*)     Glucose 108 (*)     AST 53 (*)     All other components within normal limits    Narrative:     Test Performed by [facility], [address] , [place], [place] [address]   LIPASE    Narrative:     Test Performed by [facility], [address] , [place], [place] [address]   TROPONIN    Narrative:     Test Performed by [facility], [address] , [place], [place] [address]   BRAIN NATRIURETIC PEPTIDE    Narrative:     Test Performed by [facility], [address] , [place], [place] [address]   TROPONIN    Narrative:     Test Performed by [facility], [address],  [place], [place] [address]   TROPONIN   HEMOGLOBIN A1C   CBC   LIPID PANEL   COMPREHENSIVE METABOLIC PANEL W/ REFLEX TO MG FOR LOW K         RADIOLOGY:  XR CHEST PORTABLE    Result Date: 10/29/2020  Patient Name:                SHERAL, PFAHLER MRN:                         [id number] Acct#:                       [id number] Diagnostic Radiology ACCESSION                 EXAM DATE/TIME           PROCEDURE                 ORDERING PROVIDER [id number]             10/29/2020 11:09 EDT      CR Chest Portable         GALLO, MD, DOUGALAS R. CPT code [phone]  Reason For Exam (CR Chest Portable) chest pain, wheeze, copd hx. mild leg swelling Report PORTABLE CHEST: INDICATION: Chest pain, wheezing COMPARISON:  No previous studies are available for comparison. Obtained at 1104 hours. A single portable AP radiograph of the chest was obtained.  The heart is normal in size.  The mediastinal silhouette is normal.  Chronic interstitial changes are present.  There are no effusions or acute infiltrates. There is no pleural thickening.  Arthritic changes of the spine and shoulders are present. IMPRESSION:  Chronic changes.  No acute process. Report Dictated on Workstation: [id number] ---** Final ---** Dictating Physician: Glenna Durand, ALFRED Signed Date and Time: 10/29/2020 11:17 am Signed by: Glenna Durand, ALFRED Transcribed Date and Time: 10/29/2020 11:18      EKG Interpretation:  Please see [facility] or ED attending documentation for EKG interpretation.      Medical decision making:    The patient is placed in the ED CDU for further evaluation after initial ED workup.    ED vital signs and nursing/provider notes reviewed, as well as laboratory and imaging studies.     Clinical Impression:     1.)  Chest  pain        Assessment/Plan : Chest pain  CDU protocol is initiated for COPD and chest pain.   Repeat troponin  Home oxygen evaluation  Nebulizers  Steroids              Comment: Please note this report has been produced using speech recognition software and may contain errors related to that system including errors in grammar, punctuation, and spelling, as well as words and phrases that may be inappropriate. If there are any questions or concerns please feel free to contact the dictating provider for clarification.

## 2020-10-29 NOTE — Progress Notes (Signed)
Patient came to CDU this afternoon for continuous observation related to chest pain this morning. During orientation to room, patient stated that she does not want to stay over a period of 24 hours for cardiac evaluation. Sao2 of 88 in RA. Pt. Refuses to wear oxygen. Dr. Dorene Grebe aware of situation and communicated with patient regarding refusal of treatment. Patient states that she wants to leave AMA.    -18:33 Nurse gave AMA paper to patient. Patient signed form.

## 2020-10-29 NOTE — ED Triage Notes (Signed)
Patient ambulatory to room 3 with c/o non radiating chest pain that started 1 hour PTA. Patient has c/o nausea without vomiting. Patient has a history of COPD. EKG completed. V/S obtained. Patient placed on monitor.

## 2020-10-29 NOTE — ED Notes (Signed)
Patient placed on 2 L via N/C. Physician notified.      Rudy Jew, RN  10/29/20 1125

## 2020-10-29 NOTE — ED Notes (Signed)
Patient given a warm blanket. Call light within reach.      Rudy Jew, RN  10/29/20 1135

## 2021-02-17 ENCOUNTER — Other Ambulatory Visit: Payer: Self-pay

## 2021-02-17 ENCOUNTER — Emergency Department
Admission: EM | Admit: 2021-02-17 | Discharge: 2021-02-17 | Disposition: A | Discharge status: Home / Self Care | Payer: Auto Insurance (includes no fault) | Attending: Emergency Medicine | Admitting: Emergency Medicine

## 2021-02-17 ENCOUNTER — Emergency Department (HOSPITAL_COMMUNITY): Payer: Auto Insurance (includes no fault)

## 2021-02-17 ENCOUNTER — Emergency Department (HOSPITAL_COMMUNITY): Payer: Self-pay

## 2021-02-17 DIAGNOSIS — R0789 Other chest pain: Secondary | ICD-10-CM | POA: Insufficient documentation

## 2021-02-17 DIAGNOSIS — M545 Low back pain, unspecified: Secondary | ICD-10-CM | POA: Insufficient documentation

## 2021-02-17 DIAGNOSIS — R9431 Abnormal electrocardiogram [ECG] [EKG]: Secondary | ICD-10-CM

## 2021-02-17 DIAGNOSIS — M542 Cervicalgia: Secondary | ICD-10-CM | POA: Insufficient documentation

## 2021-02-17 DIAGNOSIS — R519 Headache, unspecified: Secondary | ICD-10-CM | POA: Insufficient documentation

## 2021-02-17 LAB — BASIC METABOLIC PANEL
ANION GAP: 8 mmol/L
BUN/CREA RATIO: 18
BUN: 10 mg/dL (ref 10–25)
CALCIUM: 8.9 mg/dL (ref 8.8–10.3)
CHLORIDE: 105 mmol/L (ref 98–111)
CO2 TOTAL: 26 mmol/L (ref 21–35)
CREATININE: 0.57 mg/dL (ref <=1.30)
ESTIMATED GFR: >60 mL/min/{1.73_m2}
GLUCOSE: 102 mg/dL (ref 70–110)
POTASSIUM: 3.6 mmol/L (ref 3.5–5.0)
SODIUM: 139 mmol/L (ref 135–145)

## 2021-02-17 LAB — CBC WITH DIFF
BASOPHIL #: <0.1 10*3/uL (ref <=0.20)
BASOPHIL %: 1 %
EOSINOPHIL #: 0.16 10*3/uL (ref <=0.50)
EOSINOPHIL %: 2 %
HCT: 48.8 % — ABNORMAL HIGH (ref 34.8–46.0)
HGB: 15.9 g/dL (ref 11.5–16.0)
IMMATURE GRANULOCYTE #: <0.1 10*3/uL (ref <0.10)
IMMATURE GRANULOCYTE %: 1 % (ref 0–1)
LYMPHOCYTE #: 2.48 10*3/uL (ref 1.00–4.80)
LYMPHOCYTE %: 32 %
MCH: 30.6 pg (ref 26.0–32.0)
MCHC: 32.6 g/dL (ref 31.0–35.5)
MCV: 94 fL (ref 78.0–100.0)
MONOCYTE #: 0.4 10*3/uL (ref 0.20–1.10)
MONOCYTE %: 5 %
MPV: 10.1 fL (ref 8.7–12.5)
NEUTROPHIL #: 4.69 10*3/uL (ref 1.50–7.70)
NEUTROPHIL %: 59 %
PLATELETS: 244 10*3/uL (ref 150–400)
RBC: 5.19 10*6/uL (ref 3.85–5.22)
RDW-CV: 12.6 % (ref 11.5–15.5)
WBC: 7.9 10*3/uL (ref 3.7–11.0)

## 2021-02-17 MED ORDER — IOPAMIDOL 300 MG IODINE/ML (61 %) INTRAVENOUS SOLUTION
100.0000 mL | INTRAVENOUS | Status: AC
Start: 2021-02-17 — End: 2021-02-17
  Administered 2021-02-17: 100 mL via INTRAVENOUS

## 2021-02-17 NOTE — ED Nurses Note (Signed)
Patient discharged home with family.  AVS reviewed with patient/care giver.  A written copy of the AVS and discharge instructions was given to the patient/care giver.  Questions sufficiently answered as needed.  Patient/care giver encouraged to follow up with PCP as indicated.  In the event of an emergency, patient/care giver instructed to call 911 or go to the nearest emergency room.

## 2021-02-18 ENCOUNTER — Encounter (HOSPITAL_COMMUNITY): Payer: Self-pay | Admitting: Emergency Medicine

## 2021-02-18 LAB — ECG 12 LEAD - ED USE
Atrial Rate: 71 {beats}/min
Calculated P Axis: 79 degrees
Calculated R Axis: 43 degrees
Calculated T Axis: 63 degrees
PR Interval: 138 ms
QRS Duration: 82 ms
QT Interval: 422 ms
QTC Calculation: 458 ms
Ventricular rate: 71 {beats}/min
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
## 2022-05-30 ENCOUNTER — Other Ambulatory Visit: Payer: Self-pay

## 2022-05-30 ENCOUNTER — Ambulatory Visit
Admission: RE | Admit: 2022-05-30 | Discharge: 2022-05-30 | Disposition: A | Discharge status: Home / Self Care | Payer: Self-pay | Source: Ambulatory Visit | Attending: Orthopedic Surgery | Admitting: Orthopedic Surgery

## 2022-05-30 DIAGNOSIS — Z049 Encounter for examination and observation for unspecified reason: Secondary | ICD-10-CM

## 2022-06-07 NOTE — Progress Notes (Deleted)
Referring Physician:  Collier Salina, MD [address] [place],  [place] [address]  Primary Physician:  Doughton, Latricia Heft, MD  History of Present Illness: 06/07/2022*** Melanie Reilly has a history of COPD, HTN, depression, and basal cell carcinoma.   She was referred here for a T8 compression fracture.      Duration: *** Location: *** Quality: *** Severity: ***  Precipitating: aggravated by *** Modifying factors: made better by *** Weakness: none Timing: *** Bowel/Bladder Dysfunction: none  Conservative measures:  Physical therapy: ***  Multimodal medical therapy including regular antiinflammatories: ***  Injections: *** epidural steroid injections  Past Surgery: ***  Melanie Reilly has ***no symptoms of cervical myelopathy.  The symptoms are causing a significant impact on the patient's life.   Review of Systems:  A 10 point review of systems is negative, except for the pertinent positives and negatives detailed in the HPI.  Past Medical History: Past Medical History:  Diagnosis Date   Arthritis    Cancer ([place]) 2016   skin cancer on forehead   Complication of anesthesia    my COPD, I had difficulty breathing when I woke up.   COPD (chronic obstructive pulmonary disease) ([place])    Depression    Hypertension     Past Surgical History: Past Surgical History:  Procedure Laterality Date   ABDOMINAL HYSTERECTOMY  2010   CHOLECYSTECTOMY     DIAGNOSTIC LAPAROSCOPY     KNEE ARTHROSCOPY Left 2005   SHOULDER OPEN ROTATOR CUFF REPAIR Left 2012   TONSILLECTOMY     TOTAL HIP ARTHROPLASTY Left 08/31/2015   Procedure: TOTAL HIP ARTHROPLASTY ANTERIOR APPROACH;  Surgeon: Hessie Knows, MD;  Location: [facility] ORS;  Service: Orthopedics;  Laterality: Left;    Allergies: Allergies as of 06/08/2022   (No Known Allergies)    Medications: Outpatient Encounter Medications as of 06/08/2022  Medication Sig   Albuterol Sulfate 108 (90 Base) MCG/ACT AEPB Inhale 1 puff  into the lungs every 4 (four) hours as needed (wheezing, shortness of breath).   aspirin 81 MG tablet Take 81 mg by mouth at bedtime.   cephALEXin (KEFLEX) 500 MG capsule Take 1 capsule (500 mg total) by mouth every 6 (six) hours.   enoxaparin (LOVENOX) 40 MG/0.4ML injection Inject 0.4 mLs (40 mg total) into the skin daily.   hydrochlorothiazide (MICROZIDE) 12.5 MG capsule Take 12.5 mg by mouth every morning.   HYDROcodone-acetaminophen (NORCO/VICODIN) 5-325 MG tablet Take 1-2 tablets by mouth every 4 (four) hours as needed for moderate pain.   rOPINIRole (REQUIP) 1 MG tablet Take 1 mg by mouth 3 (three) times daily as needed.   sertraline (ZOLOFT) 100 MG tablet Take 100 mg by mouth at bedtime. 1.5 tablets at bedtime   Tiotropium Bromide Monohydrate (SPIRIVA HANDIHALER IN) Inhale 1 puff into the lungs daily as needed.   traMADol (ULTRAM) 50 MG tablet Take 1 tablet (50 mg total) by mouth every 6 (six) hours as needed.   No facility-administered encounter medications on file as of 06/08/2022.    Social History: Social History   Tobacco Use   Smoking status: Every Day    Packs/day: 1.00    Types: Cigarettes  Substance Use Topics   Alcohol use: No   Drug use: No    Family Medical History: No family history on file.  Physical Examination: There were no vitals filed for this visit.  General: Patient is well developed, well nourished, calm, collected, and in no apparent distress. Attention to examination is  appropriate.  Respiratory: Patient is breathing without any difficulty.   NEUROLOGICAL:     Awake, alert, oriented to person, place, and time.  Speech is clear and fluent. Fund of knowledge is appropriate.   Cranial Nerves: Pupils equal round and reactive to light.  Facial tone is symmetric.  Facial sensation is symmetric.  ROM of spine:  *** ROM of cervical spine *** pain *** ROM of lumbar spine *** pain  No abnormal lesions on exposed skin.   Strength: Side Biceps  Triceps Deltoid Interossei Grip Wrist Ext. Wrist Flex.  R '5 5 5 5 5 5 5  '$ L '5 5 5 5 5 5 5   '$ Side Iliopsoas Quads Hamstring PF DF EHL  R '5 5 5 5 5 5  '$ L '5 5 5 5 5 5   '$ Reflexes are ***2+ and symmetric at the biceps, triceps, brachioradialis, patella and achilles.   Hoffman's is absent.  Clonus is not present.   Bilateral upper and lower extremity sensation is intact to light touch.    No evidence of dysmetria noted.  Gait is normal.   ***No difficulty with tandem gait.    Medical Decision Making  Imaging: Thoracic xrays dated ***:  ***  I have personally reviewed the images and agree with the above interpretation.  Assessment and Plan: Melanie Reilly is a pleasant 66 y.o. female with ***  Treatment options discussed with patient and following plan made:   - Order for physical therapy for *** spine ***. - Continue on current medications including ***. Reviewed proper dosing along with risks and benefits. Take and NSAIDs with food.      I spent a total of *** minutes in face-to-face and non-face-to-face activities related to this patient's care toda including review of outside records, review of imaging, review of symptoms, physical exam, discussion of differential diagnosis, discussion of treatment options, and documentation.   Thank you for involving me in the care of this patient.   Geronimo Boot PA-C Dept. of Neurosurgery

## 2022-06-08 ENCOUNTER — Other Ambulatory Visit: Payer: Self-pay | Admitting: Neurosurgery

## 2022-06-08 ENCOUNTER — Ambulatory Visit (INDEPENDENT_AMBULATORY_CARE_PROVIDER_SITE_OTHER): Payer: Medicare HMO | Admitting: Neurosurgery

## 2022-06-08 ENCOUNTER — Encounter: Payer: Self-pay | Admitting: Neurosurgery

## 2022-06-08 ENCOUNTER — Ambulatory Visit
Admission: RE | Admit: 2022-06-08 | Discharge: 2022-06-08 | Disposition: A | Discharge status: Home / Self Care | Payer: Medicare HMO | Source: Ambulatory Visit | Attending: Neurosurgery | Admitting: Neurosurgery

## 2022-06-08 ENCOUNTER — Ambulatory Visit: Payer: Medicare HMO | Admitting: Neurosurgery

## 2022-06-08 ENCOUNTER — Ambulatory Visit
Admission: RE | Admit: 2022-06-08 | Discharge: 2022-06-08 | Disposition: A | Discharge status: Home / Self Care | Payer: Medicare HMO | Attending: Neurosurgery | Admitting: Neurosurgery

## 2022-06-08 VITALS — BP 137/80 | HR 70 | Ht 68.0 in | Wt 176.4 lb

## 2022-06-08 DIAGNOSIS — S22060A Wedge compression fracture of T7-T8 vertebra, initial encounter for closed fracture: Secondary | ICD-10-CM | POA: Diagnosis present

## 2022-06-08 DIAGNOSIS — M5416 Radiculopathy, lumbar region: Secondary | ICD-10-CM | POA: Diagnosis not present

## 2022-06-08 DIAGNOSIS — G8929 Other chronic pain: Secondary | ICD-10-CM | POA: Diagnosis not present

## 2022-06-08 DIAGNOSIS — S22000A Wedge compression fracture of unspecified thoracic vertebra, initial encounter for closed fracture: Secondary | ICD-10-CM

## 2022-06-08 NOTE — Progress Notes (Signed)
Referring Physician:  Collier Salina, MD [address] [place],  [place] [address]  Primary Physician:  Doughton, Latricia Heft, MD  History of Present Illness: 06/08/2022 Melanie Reilly has a history of COPD, HTN, depression, and basal cell carcinoma.   She was referred here for a T8 compression fracture. Today she reports a 4-year history of mid back pain which has worsened over time as well as radiating tingling into the anterior aspects of both thighs.  While her back pain has been going on for several years, the numbness and tingling in her anterior thighs is new over the last couple of months.  She denies any inciting event.  She states it is worse with walking and improved some with rest.  She has not had any incontinence or weakness in her legs.  She has undergone physical therapy however this was a couple of years ago.  She has not undergone any recent injections.  Of note she is 1/2 pack/day smoker.  Conservative measures:  Physical therapy: None recently Multimodal medical therapy including regular antiinflammatories: Has tried Norco and tramadol in the past  Injections: no epidural steroid injections  Past Surgery: No previous spinal surgeries  Clifford Coudriet has no symptoms of cervical myelopathy.  The symptoms are causing a significant impact on the patient's life.   Review of Systems:  A 10 point review of systems is negative, except for the pertinent positives and negatives detailed in the HPI.  Past Medical History: Past Medical History:  Diagnosis Date   Arthritis    Cancer ([place]) 2016   skin cancer on forehead   Complication of anesthesia    my COPD, I had difficulty breathing when I woke up.   COPD (chronic obstructive pulmonary disease) ([place])    Depression    Hypertension     Past Surgical History: Past Surgical History:  Procedure Laterality Date   ABDOMINAL HYSTERECTOMY  2010   CHOLECYSTECTOMY     DIAGNOSTIC LAPAROSCOPY     KNEE ARTHROSCOPY Left 2005    SHOULDER OPEN ROTATOR CUFF REPAIR Left 2012   TONSILLECTOMY     TOTAL HIP ARTHROPLASTY Left 08/31/2015   Procedure: TOTAL HIP ARTHROPLASTY ANTERIOR APPROACH;  Surgeon: Hessie Knows, MD;  Location: [facility] ORS;  Service: Orthopedics;  Laterality: Left;    Allergies: Allergies as of 06/08/2022   (No Known Allergies)    Medications: Outpatient Encounter Medications as of 06/08/2022  Medication Sig   atorvastatin (LIPITOR) 40 MG tablet Take 40 mg by mouth daily.   buPROPion (WELLBUTRIN XL) 150 MG 24 hr tablet Take 150 mg by mouth daily.   losartan-hydrochlorothiazide (HYZAAR) 50-12.5 MG tablet Take 1 tablet by mouth daily.   pantoprazole (PROTONIX) 40 MG tablet Take 40 mg by mouth 2 (two) times daily.   Albuterol Sulfate 108 (90 Base) MCG/ACT AEPB Inhale 1 puff into the lungs every 4 (four) hours as needed (wheezing, shortness of breath).   aspirin 81 MG tablet Take 81 mg by mouth at bedtime.   rOPINIRole (REQUIP) 1 MG tablet Take 1 mg by mouth 3 (three) times daily as needed.   sertraline (ZOLOFT) 100 MG tablet Take 100 mg by mouth at bedtime. 1.5 tablets at bedtime   Tiotropium Bromide Monohydrate (SPIRIVA HANDIHALER IN) Inhale 1 puff into the lungs daily as needed.   traMADol (ULTRAM) 50 MG tablet Take 1 tablet (50 mg total) by mouth every 6 (six) hours as needed.   [DISCONTINUED] cephALEXin (KEFLEX) 500 MG capsule Take 1 capsule (  500 mg total) by mouth every 6 (six) hours.   [DISCONTINUED] enoxaparin (LOVENOX) 40 MG/0.4ML injection Inject 0.4 mLs (40 mg total) into the skin daily.   [DISCONTINUED] hydrochlorothiazide (MICROZIDE) 12.5 MG capsule Take 12.5 mg by mouth every morning.   [DISCONTINUED] HYDROcodone-acetaminophen (NORCO/VICODIN) 5-325 MG tablet Take 1-2 tablets by mouth every 4 (four) hours as needed for moderate pain.   No facility-administered encounter medications on file as of 06/08/2022.    Social History: Social History   Tobacco Use   Smoking status: Every Day     Packs/day: 0.50    Types: Cigarettes   Smokeless tobacco: Never  Vaping Use   Vaping Use: Former  Substance Use Topics   Alcohol use: No   Drug use: No    Family Medical History: No family history on file.  Physical Examination: Vitals:   06/08/22 1308  BP: 137/80  Pulse: 70    General: Patient is well developed, well nourished, calm, collected, and in no apparent distress. Attention to examination is appropriate.  Respiratory: Patient is breathing without any difficulty.   NEUROLOGICAL:     Awake, alert, oriented to person, place, and time.  Speech is clear and fluent. Fund of knowledge is appropriate.   Cranial Nerves: Pupils equal round and reactive to light.  Facial tone is symmetric.  Facial sensation is symmetric.  ROM of spine:  WNL  No abnormal lesions on exposed skin.   Strength: Side Biceps Triceps Deltoid Interossei Grip Wrist Ext. Wrist Flex.  R '5 5 5 5 5 5 5  '$ L '5 5 5 5 5 5 5   '$ Side Iliopsoas Quads Hamstring PF DF EHL  R '5 5 5 5 5 5  '$ L '5 5 5 5 5 5   '$ Reflexes are 2+ and symmetric at the biceps, triceps, brachioradialis, patella and achilles.   Hoffman's is absent.  Decreased sensation to light touch of bilateral anterior shins  Gait is normal.    Medical Decision Making  Imaging: Thoracic xrays dated 05/11/22:  T8 compression fracture  Thoracic xrays 06/08/22: Stable appearing compression fracture   I have personally reviewed the images and agree with the above interpretation.  Assessment and Plan: Melanie Reilly is a pleasant 66 y.o. female with chronic thoracic back pain and a T8 fracture. She was unaware of her fracture and denies any recent injury. Her fracture looks largely stable on repeat xrays today however she would like to move forward with conservative therapy. We will obtain an MRI of her thoracic spine to evaluate for chronicity of her fracture and a lumbar MRI to evaluate her radicular symptoms further. I have also placed a referral to  PT at [facility] in [place] per her request. I will see her back via telephone visit in 6-8 weeks to evaluate her progress with PT and discuss her MRI results and further plan of care. She expressed understanding and was in agreement with this plan.  I spent a total of 41 minutes in face-to-face and non-face-to-face activities related to this patient's care toda including review of outside records, review of imaging, review of symptoms, physical exam, discussion of differential diagnosis, discussion of treatment options, and documentation.   Thank you for involving me in the care of this patient.   Cooper Render PA-C Dept. of Neurosurgery

## 2022-07-11 ENCOUNTER — Other Ambulatory Visit: Payer: Self-pay

## 2022-07-11 ENCOUNTER — Ambulatory Visit
Admission: RE | Admit: 2022-07-11 | Discharge: 2022-07-11 | Disposition: A | Discharge status: Home / Self Care | Payer: Self-pay | Source: Ambulatory Visit | Attending: Neurosurgery | Admitting: Neurosurgery

## 2022-07-11 DIAGNOSIS — Z049 Encounter for examination and observation for unspecified reason: Secondary | ICD-10-CM

## 2022-07-25 ENCOUNTER — Ambulatory Visit (INDEPENDENT_AMBULATORY_CARE_PROVIDER_SITE_OTHER): Payer: Medicare HMO | Admitting: Neurosurgery

## 2022-07-25 DIAGNOSIS — M48062 Spinal stenosis, lumbar region with neurogenic claudication: Secondary | ICD-10-CM | POA: Diagnosis not present

## 2022-07-25 DIAGNOSIS — M5416 Radiculopathy, lumbar region: Secondary | ICD-10-CM | POA: Diagnosis not present

## 2022-07-25 DIAGNOSIS — S22060A Wedge compression fracture of T7-T8 vertebra, initial encounter for closed fracture: Secondary | ICD-10-CM

## 2022-07-25 DIAGNOSIS — S22060D Wedge compression fracture of T7-T8 vertebra, subsequent encounter for fracture with routine healing: Secondary | ICD-10-CM | POA: Diagnosis not present

## 2022-07-25 NOTE — Progress Notes (Signed)
Neurosurgery Telephone (Audio-Only) Note  Requesting Provider     Doughton, Latricia Heft, MD [address] [place],  [place] [address] T: [phone] F: [phone]  Primary Care Provider [place], Latricia Heft, [place] [address] T: [phone] F: [phone]  Telehealth visit was conducted with Melanie Reilly, a 67 y.o. female via telephone. The patient is aware of the limitations of a telephone visit.   History of Present Illness: Melanie Reilly is a 67 y.o presenting today via telephone visit to review her MRI results and response to PT. Unfortunately she has not been able to go to PT as she did not call to set this up. She denies any changes to her symptoms.  06/08/22 visit Melanie Reilly has a history of COPD, HTN, depression, and basal cell carcinoma.    She was referred here for a T8 compression fracture. Today she reports a 4-year history of mid back pain which has worsened over time as well as radiating tingling into the anterior aspects of both thighs.  While her back pain has been going on for several years, the numbness and tingling in her anterior thighs is new over the last couple of months.  She denies any inciting event.  She states it is worse with walking and improved some with rest.  She has not had any incontinence or weakness in her legs.  She has undergone physical therapy however this was a couple of years ago.  She has not undergone any recent injections.  Of note she is 1/2 pack/day smoker.   Conservative measures:  Physical therapy: None recently Multimodal medical therapy including regular antiinflammatories: Has tried Norco and tramadol in the past  Injections: no epidural steroid injections   Past Surgery: No previous spinal surgeries   Melanie Reilly has no symptoms of cervical myelopathy.   The symptoms are causing a significant impact on the patient's life.   General Review of Systems:  A ROS was performed including pertinent  positive and negatives as documented.  All other systems are negative.   Prior to Admission medications   Medication Sig Start Date End Date Taking? Authorizing Provider  Albuterol Sulfate 108 (90 Base) MCG/ACT AEPB Inhale 1 puff into the lungs every 4 (four) hours as needed (wheezing, shortness of breath).    [provider]  aspirin 81 MG tablet Take 81 mg by mouth at bedtime.    [provider]  atorvastatin (LIPITOR) 40 MG tablet Take 40 mg by mouth daily. 05/01/22   [provider]  buPROPion (WELLBUTRIN XL) 150 MG 24 hr tablet Take 150 mg by mouth daily. 04/06/22   [provider]  losartan-hydrochlorothiazide (HYZAAR) 50-12.5 MG tablet Take 1 tablet by mouth daily. 02/16/22   [provider]  pantoprazole (PROTONIX) 40 MG tablet Take 40 mg by mouth 2 (two) times daily. 05/16/22   [provider]  rOPINIRole (REQUIP) 1 MG tablet Take 1 mg by mouth 3 (three) times daily as needed.    [provider]  sertraline (ZOLOFT) 100 MG tablet Take 100 mg by mouth at bedtime. 1.5 tablets at bedtime    [provider]  Tiotropium Bromide Monohydrate (SPIRIVA HANDIHALER IN) Inhale 1 puff into the lungs daily as needed.    [provider]  traMADol (ULTRAM) 50 MG tablet Take 1 tablet (50 mg total) by mouth every 6 (six) hours as needed. 09/03/15   Duanne Guess, PA-C   DATA REVIEWED    Imaging  Studies    BOTH STUDIES MISSING AXIAL VIEWS  MRI T spine 07/04/22 FINDINGS:  There is edema of the T12 vertebral body suggesting an inferior plate acute Schmorl's node with possible a small compression fracture. There is no spinal canal stenosis at this level. There is also old wedge deformity of the T8 vertebral body. Kyphosis is present. Signal intensity from the bone marrow is unremarkable.   MRI L spine 07/04/22 IMPRESSION:  Lumbar spondylotic changes with varying degrees of spinal canal and neural foraminal narrowings  slightly worsened intervally compared to previous MRI.  High-grade spinal canal stenosis at L2-L3 and L4-L5. High-grade neural foraminal stenosis at L4-L5 and L5-S1.    IMPRESSION  Melanie Reilly is a 67 y.o. female who I performed a telephone encounter today for evaluation and management of lumbar stenosis  PLAN  Melanie Reilly is a 68 y.o presenting with symptomatic lumbar stenosis. I have placed another referral for PT and instructed her to call to set this up. Given her MRI results, her L2-3 and L4-5 stenosis is likely the cause of her symptoms. She would like to attempt injections for this. I have referred her to Dr. Holley Raring for consideration. I will see her back in 6 weeks via telephone visit to review her response to conservative treatment and discuss next steps if she remains unchanged. She was encouraged to call the office in the interim should she have any questions or concerns. She expressed understanding and was in agreement with this plan.   No orders of the defined types were placed in this encounter.   DISPOSITION  Follow up: In person appointment in 6 weeks  Loleta Dicker, PA   TELEPHONE DOCUMENTATION   This visit was performed via telephone.  Patient location: home Provider location: office  I spent a total of 10 minutes non-face-to-face activities for this visit on the date of this encounter including review of current clinical condition and response to treatment.

## 2022-09-07 ENCOUNTER — Telehealth: Payer: Medicare HMO | Admitting: Neurosurgery

## 2022-09-14 ENCOUNTER — Ambulatory Visit: Payer: Medicare HMO | Admitting: Student in an Organized Health Care Education/Training Program

## 2022-09-21 ENCOUNTER — Ambulatory Visit (INDEPENDENT_AMBULATORY_CARE_PROVIDER_SITE_OTHER): Payer: Medicare HMO | Admitting: Neurosurgery

## 2022-09-21 DIAGNOSIS — M48062 Spinal stenosis, lumbar region with neurogenic claudication: Secondary | ICD-10-CM | POA: Diagnosis not present

## 2022-09-21 NOTE — Progress Notes (Signed)
Neurosurgery Telephone (Audio-Only) Note  Requesting Provider     No referring provider defined for this encounter. T: N/A F:   Primary Care Provider Doughton, Latricia Heft, [place] [address] T: [phone] F: [phone]  Telehealth visit was conducted with Melanie Reilly, a 67 y.o. female via telephone. The patient is aware of the limitations of a telephone visit.   History of Present Illness: Melanie Reilly is a 67 year old with severe lumbar stenosis presenting today via telephone visit to discuss her response to physical therapy.  She states that she has completed physical therapy at [facility] which did not provide her with any relief of her symptoms.  She is scheduled for an injection with Dr. Holley Raring on 3/28 and is hopeful that this will give her relief of her symptoms.  She denies any significant changes to her back and bilateral leg pain.  07/25/22 telephone visit  Melanie Reilly is a 67 y.o presenting today via telephone visit to review her MRI results and response to PT. Unfortunately she has not been able to go to PT as she did not call to set this up. She denies any changes to her symptoms.  06/08/22 visit Melanie Reilly has a history of COPD, HTN, depression, and basal cell carcinoma.    She was referred here for a T8 compression fracture. Today she reports a 4-year history of mid back pain which has worsened over time as well as radiating tingling into the anterior aspects of both thighs.  While her back pain has been going on for several years, the numbness and tingling in her anterior thighs is new over the last couple of months.  She denies any inciting event.  She states it is worse with walking and improved some with rest.  She has not had any incontinence or weakness in her legs.  She has undergone physical therapy however this was a couple of years ago.  She has not undergone any recent injections.  Of note she is 1/2 pack/day smoker.   Conservative  measures:  Physical therapy: None recently Multimodal medical therapy including regular antiinflammatories: Has tried Norco and tramadol in the past  Injections: no epidural steroid injections   Past Surgery: No previous spinal surgeries   Melanie Reilly has no symptoms of cervical myelopathy.   The symptoms are causing a significant impact on the patient's life.   General Review of Systems:  A ROS was performed including pertinent positive and negatives as documented.  All other systems are negative.   Prior to Admission medications   Medication Sig Start Date End Date Taking? Authorizing Provider  Albuterol Sulfate 108 (90 Base) MCG/ACT AEPB Inhale 1 puff into the lungs every 4 (four) hours as needed (wheezing, shortness of breath).    [provider]  aspirin 81 MG tablet Take 81 mg by mouth at bedtime.    [provider]  atorvastatin (LIPITOR) 40 MG tablet Take 40 mg by mouth daily. 05/01/22   [provider]  buPROPion (WELLBUTRIN XL) 150 MG 24 hr tablet Take 150 mg by mouth daily. 04/06/22   [provider]  losartan-hydrochlorothiazide (HYZAAR) 50-12.5 MG tablet Take 1 tablet by mouth daily. 02/16/22   [provider]  pantoprazole (PROTONIX) 40 MG tablet Take 40 mg by mouth 2 (two) times daily. 05/16/22   [provider]  rOPINIRole (REQUIP) 1 MG tablet Take 1 mg by mouth 3 (three) times daily as needed.    [provider]  sertraline (ZOLOFT) 100 MG tablet Take 100 mg by mouth at bedtime. 1.5 tablets at bedtime    [provider]  Tiotropium Bromide Monohydrate (SPIRIVA HANDIHALER IN) Inhale 1 puff into the lungs daily as needed.    [provider]  traMADol (ULTRAM) 50 MG tablet Take 1 tablet (50 mg total) by mouth every 6 (six) hours as needed. 09/03/15   Duanne Guess, PA-C   DATA REVIEWED    Imaging Studies    BOTH STUDIES MISSING AXIAL VIEWS  MRI T spine 07/04/22 FINDINGS:  There is  edema of the T12 vertebral body suggesting an inferior plate acute Schmorl's node with possible a small compression fracture. There is no spinal canal stenosis at this level. There is also old wedge deformity of the T8 vertebral body. Kyphosis is present. Signal intensity from the bone marrow is unremarkable.   MRI L spine 07/04/22 IMPRESSION:  Lumbar spondylotic changes with varying degrees of spinal canal and neural foraminal narrowings slightly worsened intervally compared to previous MRI.  High-grade spinal canal stenosis at L2-L3 and L4-L5. High-grade neural foraminal stenosis at L4-L5 and L5-S1.    IMPRESSION  Melanie Reilly is a 67 y.o. female who I performed a telephone encounter today for evaluation and management of lumbar stenosis  PLAN  Melanie Reilly is a 67 y.o presenting with symptomatic lumbar stenosis.  Our office will obtain her physical therapy notes from [facility].  I encouraged her to keep her upcoming appointment with Dr. Holley Raring.  I will see her back after this visit to discuss her response to injections.  She was encouraged to call the office in the interim should she have any questions or concerns.  She expressed understanding was in agreement with this plan. No orders of the defined types were placed in this encounter.   DISPOSITION  Follow up: 4-6 weeks  Loleta Dicker, PA   TELEPHONE DOCUMENTATION   This visit was performed via telephone.  Patient location: home Provider location: office  I spent a total of 5 minutes non-face-to-face activities for this visit on the date of this encounter including review of current clinical condition and response to treatment.

## 2022-10-05 ENCOUNTER — Ambulatory Visit
Payer: Medicare HMO | Attending: Student in an Organized Health Care Education/Training Program | Admitting: Student in an Organized Health Care Education/Training Program

## 2022-10-05 ENCOUNTER — Encounter: Payer: Self-pay | Admitting: Student in an Organized Health Care Education/Training Program

## 2022-10-05 VITALS — BP 122/85 | HR 72 | Temp 98.8°F | Ht 69.0 in | Wt 180.0 lb

## 2022-10-05 DIAGNOSIS — M48062 Spinal stenosis, lumbar region with neurogenic claudication: Secondary | ICD-10-CM | POA: Diagnosis present

## 2022-10-05 DIAGNOSIS — M47816 Spondylosis without myelopathy or radiculopathy, lumbar region: Secondary | ICD-10-CM | POA: Insufficient documentation

## 2022-10-05 DIAGNOSIS — G8929 Other chronic pain: Secondary | ICD-10-CM | POA: Diagnosis present

## 2022-10-05 DIAGNOSIS — M5136 Other intervertebral disc degeneration, lumbar region: Secondary | ICD-10-CM | POA: Diagnosis present

## 2022-10-05 DIAGNOSIS — M5416 Radiculopathy, lumbar region: Secondary | ICD-10-CM | POA: Diagnosis present

## 2022-10-05 DIAGNOSIS — G894 Chronic pain syndrome: Secondary | ICD-10-CM | POA: Insufficient documentation

## 2022-10-05 MED ORDER — TIZANIDINE HCL 4 MG PO TABS: 4.0000 mg | ORAL_TABLET | Freq: Every evening | ORAL | 0 refills | Status: AC | PRN

## 2022-10-05 MED ORDER — PREGABALIN 25 MG PO CAPS: 25.0000 mg | ORAL_CAPSULE | Freq: Two times a day (BID) | ORAL | 1 refills | Status: DC

## 2022-10-05 NOTE — Progress Notes (Signed)
Patient: Melanie Reilly  Service Category: E/M  Provider: Gillis Santa, MD  DOB: Jan 15, 1956  DOS: 10/05/2022  Referring Provider: Loleta Dicker, PA  MRN: [id number]  Setting: Ambulatory outpatient  PCP: Doughton, Latricia Heft, MD  Type: New Patient  Specialty: Interventional Pain Management    Location: Office  Delivery: Face-to-face     Primary Reason(s) for Visit: Encounter for initial evaluation of one or more chronic problems (new to examiner) potentially causing chronic pain, and posing a threat to normal musculoskeletal function. (Level of risk: High) CC: Back Pain (lower)  HPI  Melanie Reilly is a 67 y.o. year old, female patient, who comes for the first time to our practice referred by Loleta Dicker, PA for our initial evaluation of her chronic pain. She has Primary osteoarthritis of left hip; Lumbar radiculopathy; Lumbar facet arthropathy; Spinal stenosis, lumbar region, with neurogenic claudication; Lumbar degenerative disc disease; and Chronic pain syndrome on their problem list. Today she comes in for evaluation of her Back Pain (lower)  Pain Assessment: Location: Lower Back Radiating: radiates to right knee Onset: More than a month ago Duration: Chronic pain Quality: Aching, Throbbing, Constant Severity: 8 /10 (subjective, self-reported pain score)  Effect on ADL: limits ADLs Timing: Constant Modifying factors: laying down BP: 122/85  HR: 72  Onset and Duration: Present longer than 3 months Cause of pain: Unknown Severity: Getting worse and NAS-11 now: 8/10 Timing: Afternoon and After a period of immobility Aggravating Factors: Bending, Climbing, Kneeling, Lifiting, Prolonged sitting, Squatting, Stooping , Walking, and Walking uphill Alleviating Factors: Medications, Resting, and Warm showers or baths Associated Problems: Inability to control bowel, Temperature changes, and Pain that wakes patient up Quality of Pain: Aching, Constant, Heavy, Throbbing, and Tingling Previous  Examinations or Tests: Bone scan, MRI scan, and X-rays Previous Treatments: Narcotic medications and Physical Therapy  Melanie Reilly is being evaluated for possible interventional pain management therapies for the treatment of her chronic pain.   Melanie Reilly is a pleasant 67 year old female who presents with a chief complaint of right greater than left low back pain that has been present for 15 to 20 years with radiation down her right leg.  She is being referred by Cooper Render from neurosurgery for consideration of spinal injections.  She states that she does have pain with prolonged standing or walking.  She states that she utilizes a shopping cart to flex forward while she is at the grocery store and this helps to alleviate her pain.  She states that sitting down and resting also helps to alleviate her pain.  She also endorses midthoracic back pain and hears an audible "grinding noise" when she goes from flexion to extension in her thoracic spine.  She has a history of thoracic compression fracture, no inciting or traumatic event that she can recall.  She has undergone physical therapy a couple of years ago.  She smokes 1/2 pack/day.  She has not had any spinal injections.  She has not tried gabapentin or Lyrica.  She states that her sister is on gabapentin and has noticed cognitive issues while being on it so she would like to avoid gabapentin.  She does have symptoms of urinary incontinence, she states that she is hoping to see a urologist soon.   Meds   Current Outpatient Medications:    Albuterol Sulfate 108 (90 Base) MCG/ACT AEPB, Inhale 1 puff into the lungs every 4 (four) hours as needed (wheezing, shortness of breath)., Disp: , Rfl:    aspirin 81 MG  tablet, Take 81 mg by mouth at bedtime., Disp: , Rfl:    buPROPion (WELLBUTRIN XL) 150 MG 24 hr tablet, Take 150 mg by mouth daily., Disp: , Rfl:    PATADAY 0.2 % SOLN, Apply 1 drop to eye daily., Disp: , Rfl:    pregabalin (LYRICA) 25 MG capsule,  Take 1 capsule (25 mg total) by mouth 2 (two) times daily., Disp: 60 capsule, Rfl: 1   rOPINIRole (REQUIP) 1 MG tablet, Take 1 mg by mouth 3 (three) times daily as needed., Disp: , Rfl:    sertraline (ZOLOFT) 100 MG tablet, Take 100 mg by mouth at bedtime. 1.5 tablets at bedtime, Disp: , Rfl:    Tiotropium Bromide Monohydrate (SPIRIVA HANDIHALER IN), Inhale 1 puff into the lungs daily as needed., Disp: , Rfl:    tiZANidine (ZANAFLEX) 4 MG tablet, Take 1 tablet (4 mg total) by mouth at bedtime as needed for muscle spasms., Disp: 60 tablet, Rfl: 0   traMADol (ULTRAM) 50 MG tablet, Take 1 tablet (50 mg total) by mouth every 6 (six) hours as needed., Disp: 30 tablet, Rfl: 0   atorvastatin (LIPITOR) 40 MG tablet, Take 40 mg by mouth daily. (Patient not taking: Reported on 10/05/2022), Disp: , Rfl:    D3-50 1.25 MG (50000 UT) capsule, Take 50,000 Units by mouth once a week. (Patient not taking: Reported on 10/05/2022), Disp: , Rfl:    losartan-hydrochlorothiazide (HYZAAR) 50-12.5 MG tablet, Take 1 tablet by mouth daily. (Patient not taking: Reported on 10/05/2022), Disp: , Rfl:    pantoprazole (PROTONIX) 40 MG tablet, Take 40 mg by mouth 2 (two) times daily. (Patient not taking: Reported on 10/05/2022), Disp: , Rfl:   Imaging Review  DG Thoracic Spine 2 View  Narrative CLINICAL DATA:  Compression fracture of T8 diagnosed 3 months ago.  EXAM: THORACIC SPINE 2 VIEWS  COMPARISON:  May 11, 2022  FINDINGS: 40% compression deformity of T8 is identified unchanged compared prior exam. There is 20% compression deformity of superior endplate of 624THL. Mild degenerative joint changes of the spine are noted.  IMPRESSION: 40% compression deformity of T8 is unchanged compared prior exam. 20% compression deformity of superior endplate of 624THL. (This was not included on previous lateral image.)   Electronically Signed By: Abelardo Diesel M.D. On: 06/09/2022 13:31   Narrative CLINICAL DATA:  Left hip  arthroplasty  EXAM: DG HIP (WITH OR WITHOUT PELVIS) 2-3V LEFT  COMPARISON:  None.  FINDINGS: Total left hip arthroplasty without periprosthetic fracture or dislocation.  IMPRESSION: Left hip arthroplasty without acute finding.   Electronically Signed By: Monte Fantasia M.D. On: 08/31/2015 14:06  Impression  Edema involving the inferior endplate of 624THL and slight wedging of that vertebral body. This may represent an inferior endplate fracture with a superimposed Schmorl's node. This finding was not present on the lumbar spine MRI performed in 2016.  Old compression deformity of T8.  Kyphosis. Narrative  EXAM: Magnetic resonance imaging, spinal canal and contents, thoracic, without contrast material. DATE: 07/04/2022 3:15 PM ACCESSION: [id number] UN DICTATED: 07/04/2022 4:39 PM INTERPRETATION LOCATION: 54.  CLINICAL INDICATION: 67 years old Female with Chronic bilateral thoracic back pain  - M54.6 - Chronic bilateral thoracic back pain - G89.29 - Chronic bilateral thoracic back pain - M54.16 - Lumbar radiculopathy - S22.000A - Compression fracture of thoracic vertebra, unspecified thoracic vertebral lev    COMPARISON: None  TECHNIQUE: Multiplanar MRI was performed through the thoracic spine without contrast administration  FINDINGS: There is edema of the T12  vertebral body suggesting an inferior plate acute Schmorl's node with possible a small compression fracture. There is no spinal canal stenosis at this level. There is also old wedge deformity of the T8 vertebral body. Kyphosis is present. Signal intensity from the bone marrow is unremarkable.  The paraspinal tissues are within normal limits. Procedure Note  Marvis Repress, MD - 07/04/2022 Formatting of this note might be different from the original. EXAM: Magnetic resonance imaging, spinal canal and contents, thoracic, without contrast material. DATE: 07/04/2022 3:15 PM ACCESSION: [id number] UN DICTATED:  07/04/2022 4:39 PM INTERPRETATION LOCATION: 54.  CLINICAL INDICATION: 67 years old Female with Chronic bilateral thoracic back pain  - M54.6 - Chronic bilateral thoracic back pain - G89.29 - Chronic bilateral thoracic back pain - M54.16 - Lumbar radiculopathy - S22.000A - Compression fracture of thoracic vertebra, unspecified thoracic vertebral lev    COMPARISON: None  TECHNIQUE: Multiplanar MRI was performed through the thoracic spine without contrast administration  FINDINGS: There is edema of the T12 vertebral body suggesting an inferior plate acute Schmorl's node with possible a small compression fracture. There is no spinal canal stenosis at this level. There is also old wedge deformity of the T8 vertebral body. Kyphosis is present. Signal intensity from the bone marrow is unremarkable.  The paraspinal tissues are within normal limits.  IMPRESSION: Edema involving the inferior endplate of 624THL and slight wedging of that vertebral body. This may represent an inferior endplate fracture with a superimposed Schmorl's node. This finding was not present on the lumbar spine MRI performed in 2016.  Old compression deformity of T8.   Anatomical Region Laterality Modality  L-spine -- Magnetic Resonance   Impression  Lumbar spondylotic changes with varying degrees of spinal canal and neural foraminal narrowings slightly worsened intervally compared to previous MRI. High-grade spinal canal stenosis at L2-L3 and L4-L5. High-grade neural foraminal stenosis at L4-L5 and L5-S1. Narrative  EXAM: Magnetic resonance imaging, spinal canal and contents, lumbar, without contrast material. DATE: 07/04/2022 3:15 PM ACCESSION: [id number] UN DICTATED: 07/04/2022 4:30 PM INTERPRETATION LOCATION: [place]  CLINICAL INDICATION: 67 years old Female with Chronic bilateral thoracic back pain  - M54.6 - Chronic bilateral thoracic back pain - G89.29 - Chronic bilateral thoracic back pain - M54.16 -  Lumbar radiculopathy - S22.000A - Compression fracture of thoracic vertebra, unspecified thoracic vertebral lev    COMPARISON: Previous lumbar spine 02/25/2015.  TECHNIQUE: Multiplanar MRI was performed through the lumbar spine without intravenous contrast.  FINDINGS: Cervical lordotic curvature is preserved. Vertebral body heights are normal.  Grade I anterolisthesis of L3 over L4 and L4 over L5. Trace dextroscoliosis of the lumbar spine. Otherwise unremarkable vertebral alignment. No significant lateral listhesis at any level.  Partly visualized abnormal marrow signals in the T12 vertebral bodies, better evaluated on same-day thoracic spine MRI. See detailed separately dictated report of the same day thoracic spine for further characterization.  Nonspecific hyperintense signals in posterior elements of L4 vertebral body, nonspecific. There is a  Otherwise marrow signals.  The distal spinal cord returns normal signals. No evidence of cord compression. Normal termination of conus medullaris.  Multilevel disc degeneration in the lumbar spine.  L1-L2: Diffuse disc bulge with ligamentum flavum thickening and mild facet arthropathy resulting in mild spinal canal stenosis and mild to moderate left neural foraminal stenosis.  L2-L3: Diffuse disc bulge with ligamentum flavum thickening and mild facet arthrosis results in mild spinal canal stenosis,. Bilateral neural foramina are patent.  L3-L4: Diffuse disc bulge with malalignment, ligamentum flavum thickening  and bilateral facet arthrosis resulting in moderate spinal canal stenosis, and mild left neural foraminal stenosis. Right neural foramen is patent.  L4-L5: Diffuse disc bulge with malalignment, ligamentum flavum thickening and facet arthrosis, contributing to moderate to severe spinal canal stenosis, and mild bilateral neural foraminal narrowing.  L5-S1: Diffuse disc bulge with right predominant facet arthrosis, resulting in mild  thecal sac indentation and mild left and moderate right neural foraminal stenosis. Fatty atrophy of the posterior paraspinal muscles.  For the purposes of this dictation, the lowest well formed intervertebral disc space is assumed to be the L5-S1 level, and there are presumed to be five lumbar-type vertebral bodies.  Complexity Note: Imaging results reviewed.                         ROS  Cardiovascular: Daily Aspirin intake Pulmonary or Respiratory: Lung problems and Smoking Neurological: Incontinence:  Urinary Psychological-Psychiatric: Depressed Gastrointestinal: Reflux or heatburn Hematological: No reported hematological signs or symptoms such as prolonged bleeding, low or poor functioning platelets, bruising or bleeding easily, hereditary bleeding problems, low energy levels due to low hemoglobin or being anemic Endocrine: No reported endocrine signs or symptoms such as high or low blood sugar, rapid heart rate due to high thyroid levels, obesity or weight gain due to slow thyroid or thyroid disease Rheumatologic: Rheumatoid arthritis Musculoskeletal: Negative for myasthenia gravis, muscular dystrophy, multiple sclerosis or malignant hyperthermia Work History: Retired  Allergies  Melanie Reilly has No Known Allergies.  Laboratory Chemistry Profile   Renal Lab Results  Component Value Date   BUN 11 09/02/2015   CREATININE 0.43 (L) 09/02/2015   GFRAA >60 09/02/2015   GFRNONAA >60 09/02/2015   PROTEINUR NEGATIVE 08/19/2015     Electrolytes Lab Results  Component Value Date   NA 140 09/02/2015   K 3.6 09/02/2015   CL 104 09/02/2015   CALCIUM 8.3 (L) 09/02/2015     Hepatic No results found for: "AST", "ALT", "ALBUMIN", "ALKPHOS", "AMYLASE", "LIPASE", "AMMONIA"   ID Lab Results  Component Value Date   STAPHAUREUS NEGATIVE 08/19/2015   MRSAPCR NEGATIVE 08/19/2015     Bone No results found for: "VD25OH", "VD125OH2TOT", "[id number]", "UK:[id number]", "25OHVITD1", "25OHVITD2",  "[id number]", "TESTOFREE", "TESTOSTERONE"   Endocrine Lab Results  Component Value Date   GLUCOSE 143 (H) 09/02/2015   GLUCOSEU NEGATIVE 08/19/2015     Neuropathy No results found for: "VITAMINB12", "FOLATE", "HGBA1C", "HIV"   CNS No results found for: "COLORCSF", "APPEARCSF", "RBCCOUNTCSF", "WBCCSF", "POLYSCSF", "LYMPHSCSF", "EOSCSF", "PROTEINCSF", "GLUCCSF", "JCVIRUS", "CSFOLI", "IGGCSF", "LABACHR", "ACETBL"   Inflammation (CRP: Acute  ESR: Chronic) Lab Results  Component Value Date   ESRSEDRATE 3 08/19/2015     Rheumatology No results found for: "RF", "ANA", "LABURIC", "URICUR", "LYMEIGGIGMAB", "LYMEABIGMQN", "HLAB27"   Coagulation Lab Results  Component Value Date   INR 1.07 08/19/2015   LABPROT 14.1 08/19/2015   APTT 30 08/19/2015   PLT 187 09/02/2015     Cardiovascular Lab Results  Component Value Date   HGB 12.5 09/02/2015   HCT 36.7 09/02/2015     Screening Lab Results  Component Value Date   STAPHAUREUS NEGATIVE 08/19/2015   MRSAPCR NEGATIVE 08/19/2015     Cancer No results found for: "CEA", "CA125", "LABCA2"   Allergens No results found for: "ALMOND", "APPLE", "ASPARAGUS", "AVOCADO", "BANANA", "BARLEY", "BASIL", "BAYLEAF", "GREENBEAN", "LIMABEAN", "WHITEBEAN", "BEEFIGE", "REDBEET", "BLUEBERRY", "BROCCOLI", "CABBAGE", "MELON", "CARROT", "CASEIN", "CASHEWNUT", "CAULIFLOWER", "CELERY"     Note: Lab results reviewed.  [place]  Drug: Melanie Reilly  reports  no history of drug use. Alcohol:  reports no history of alcohol use. Tobacco:  reports that she has been smoking cigarettes. She has been smoking an average of .5 packs per day. She has never used smokeless tobacco. Medical:  has a past medical history of Arthritis, Cancer ([place]) (Q000111Q), Complication of anesthesia, COPD (chronic obstructive pulmonary disease) ([place]), Depression, and Hypertension. Family: family history is not on file.  Past Surgical History:  Procedure Laterality Date   ABDOMINAL HYSTERECTOMY   2010   CHOLECYSTECTOMY     DIAGNOSTIC LAPAROSCOPY     KNEE ARTHROSCOPY Left 2005   SHOULDER OPEN ROTATOR CUFF REPAIR Left 2012   TONSILLECTOMY     TOTAL HIP ARTHROPLASTY Left 08/31/2015   Procedure: TOTAL HIP ARTHROPLASTY ANTERIOR APPROACH;  Surgeon: Hessie Knows, MD;  Location: [facility] ORS;  Service: Orthopedics;  Laterality: Left;   Active Ambulatory Problems    Diagnosis Date Noted   Primary osteoarthritis of left hip 08/31/2015   Lumbar radiculopathy 10/05/2022   Lumbar facet arthropathy 10/05/2022   Spinal stenosis, lumbar region, with neurogenic claudication 10/05/2022   Lumbar degenerative disc disease 10/05/2022   Chronic pain syndrome 10/05/2022   Resolved Ambulatory Problems    Diagnosis Date Noted   No Resolved Ambulatory Problems   Past Medical History:  Diagnosis Date   Arthritis    Cancer ([place]) Q000111Q   Complication of anesthesia    COPD (chronic obstructive pulmonary disease) ([place])    Depression    Hypertension    Constitutional Exam  General appearance: Well nourished, well developed, and well hydrated. In no apparent acute distress Vitals:   10/05/22 0958  BP: 122/85  Pulse: 72  Temp: 98.8 F (37.1 C)  TempSrc: Temporal  SpO2: 94%  Weight: 180 lb (81.6 kg)  Height: 5\' 9"  (1.753 m)   BMI Assessment: Estimated body mass index is 26.58 kg/m as calculated from the following:   Height as of this encounter: 5\' 9"  (1.753 m).   Weight as of this encounter: 180 lb (81.6 kg).  BMI interpretation table: BMI level Category Range association with higher incidence of chronic pain  <18 kg/m2 Underweight   18.5-24.9 kg/m2 Ideal body weight   25-29.9 kg/m2 Overweight Increased incidence by 20%  30-34.9 kg/m2 Obese (Class I) Increased incidence by 68%  35-39.9 kg/m2 Severe obesity (Class II) Increased incidence by 136%  >40 kg/m2 Extreme obesity (Class III) Increased incidence by 254%   Patient's current BMI Ideal Body weight  Body mass index is 26.58 kg/m. Ideal  body weight: 66.2 kg (145 lb 15.1 oz) Adjusted ideal body weight: 72.4 kg (159 lb 9.1 oz)   BMI Readings from Last 4 Encounters:  10/05/22 26.58 kg/m  06/08/22 26.82 kg/m  08/31/15 31.18 kg/m  08/19/15 29.98 kg/m   Wt Readings from Last 4 Encounters:  10/05/22 180 lb (81.6 kg)  06/08/22 176 lb 6.4 oz (80 kg)  08/31/15 217 lb 4.8 oz (98.6 kg)  08/19/15 206 lb (93.4 kg)    Psych/Mental status: Alert, oriented x 3 (person, place, & time)       Eyes: PERLA Respiratory: No evidence of acute respiratory distress  Thoracic Spine Area Exam  Skin & Axial Inspection: No masses, redness, or swelling Alignment: Symmetrical Functional ROM: Pain restricted ROM Stability: No instability detected Muscle Tone/Strength: Functionally intact. No obvious neuro-muscular anomalies detected. Sensory (Neurological): Musculoskeletal pain pattern Muscle strength & Tone: No palpable anomalies Lumbar Spine Area Exam  Skin & Axial Inspection: No masses, redness, or swelling Alignment:  Symmetrical Functional ROM: Pain restricted ROM affecting both sides Stability: No instability detected Muscle Tone/Strength: Functionally intact. No obvious neuro-muscular anomalies detected. Sensory (Neurological): Dermatomal and Musculoskeletal pain pattern Palpation: Complains of area being tender to palpation       Provocative Tests: Hyperextension/rotation test: (+) bilaterally for facet joint pain. Lumbar quadrant test (Kemp's test): (+) bilateral for foraminal stenosis Lateral bending test: (+) ipsilateral radicular pain, bilaterally. Positive for bilateral foraminal stenosis.  Gait & Posture Assessment  Ambulation: Unassisted Gait: Antalgic gait (limping) Posture: Difficulty standing up straight, due to pain  Lower Extremity Exam    Side: Right lower extremity  Side: Left lower extremity  Stability: No instability observed          Stability: No instability observed          Skin & Extremity Inspection:  Skin color, temperature, and hair growth are WNL. No peripheral edema or cyanosis. No masses, redness, swelling, asymmetry, or associated skin lesions. No contractures.  Skin & Extremity Inspection: Skin color, temperature, and hair growth are WNL. No peripheral edema or cyanosis. No masses, redness, swelling, asymmetry, or associated skin lesions. No contractures.  Functional ROM: Pain restricted ROM for hip and knee joints          Functional ROM: Unrestricted ROM                  Muscle Tone/Strength: Functionally intact. No obvious neuro-muscular anomalies detected.  Muscle Tone/Strength: Functionally intact. No obvious neuro-muscular anomalies detected.  Sensory (Neurological): Dermatomal pain pattern        Sensory (Neurological): Unimpaired        DTR: Patellar: deferred today Achilles: deferred today Plantar: deferred today  DTR: Patellar: deferred today Achilles: deferred today Plantar: deferred today  Palpation: No palpable anomalies  Palpation: No palpable anomalies   5 out of 5 strength bilateral lower extremity: Plantar flexion, dorsiflexion, knee flexion, knee extension.   Assessment  Primary Diagnosis & Pertinent Problem List: The primary encounter diagnosis was Chronic radicular lumbar pain. Diagnoses of Lumbar radiculopathy, Lumbar facet arthropathy, Spinal stenosis, lumbar region, with neurogenic claudication, Lumbar degenerative disc disease, and Chronic pain syndrome were also pertinent to this visit.  Visit Diagnosis (New problems to examiner): 1. Chronic radicular lumbar pain   2. Lumbar radiculopathy   3. Lumbar facet arthropathy   4. Spinal stenosis, lumbar region, with neurogenic claudication   5. Lumbar degenerative disc disease   6. Chronic pain syndrome    Plan of Care (Initial workup plan)  I reviewed the patient's lumbar MRI with her in detail.  She has spinal canal stenosis at multiple levels which is severe at L2-L3 and L4-L5 along with neuroforaminal  stenosis at L4-L5 and L5-S1.  Additionally she has lumbar spondylosis at L3, L4, L5 with associated facet arthropathy.  We discussed starting with a lumbar epidural steroid injection.  Risks and benefits of this were reviewed.  Patient states that she has anxiety related to injection so we will plan on doing this with IV midazolam in clinic.  Future considerations could also include diagnostic lumbar facet medial branch nerve blocks to help address her low back pain related to lumbar facet arthropathy.  I also recommend that she consider lumbar bracing which I will send in a prescription for as well as a TENS unit that she can utilize for musculoskeletal lumbar spine pain.  I recommend she start Lyrica as below and consider tizanidine as needed when she has muscle cramps in her legs.  We  also discussed the importance of smoking cessation and I provided her with strategies that she could consider such as Chantix.   Procedure Orders         Lumbar Epidural Injection     Pharmacotherapy (current): Medications ordered:  Meds ordered this encounter  Medications   pregabalin (LYRICA) 25 MG capsule    Sig: Take 1 capsule (25 mg total) by mouth 2 (two) times daily.    Dispense:  60 capsule    Refill:  1   tiZANidine (ZANAFLEX) 4 MG tablet    Sig: Take 1 tablet (4 mg total) by mouth at bedtime as needed for muscle spasms.    Dispense:  60 tablet    Refill:  0    Do not place this medication, or any other prescription from our practice, on "Automatic Refill". Patient may have prescription filled one day early if pharmacy is closed on scheduled refill date.   Medications administered during this visit: Melanie Reilly had no medications administered during this visit.   Analgesic Pharmacotherapy:  Opioid Analgesics: avoid for now, will focus on non-opioid analgesics   Membrane stabilizer:  Patient does not want to trial gabapentin.  Trial of Lyrica as above.  Future considerations include Cymbalta,  TCA  Muscle relaxant:  Trial of tizanidine as above  NSAID: To be determined at a later time  Other analgesic(s): To be determined at a later time   Interventional management options: Melanie Reilly was informed that there is no guarantee that she would be a candidate for interventional therapies. The decision will be based on the results of diagnostic studies, as well as Ms. Kratzke's risk profile.  Procedure(s) under consideration:  Lumbar epidural steroid injection Lumbar facet medial branch nerve block MILD SCS trial    Provider-requested follow-up: Return in about 20 days (around 10/25/2022).  Future Appointments  Date Time Provider [place]  10/24/2022  2:00 PM Loleta Dicker, [place] [facility] None    Duration of encounter: 2minutes.  Total time on encounter, as per AMA guidelines included both the face-to-face and non-face-to-face time personally spent by the physician and/or other qualified health care professional(s) on the day of the encounter (includes time in activities that require the physician or other qualified health care professional and does not include time in activities normally performed by clinical staff). Physician's time may include the following activities when performed: Preparing to see the patient (e.g., pre-charting review of records, searching for previously ordered imaging, lab work, and nerve conduction tests) Review of prior analgesic pharmacotherapies. Reviewing PMP Interpreting ordered tests (e.g., lab work, imaging, nerve conduction tests) Performing post-procedure evaluations, including interpretation of diagnostic procedures Obtaining and/or reviewing separately obtained history Performing a medically appropriate examination and/or evaluation Counseling and educating the patient/family/caregiver Ordering medications, tests, or procedures Referring and communicating with other health care professionals (when not separately reported) Documenting  clinical information in the electronic or other health record Independently interpreting results (not separately reported) and communicating results to the patient/ family/caregiver Care coordination (not separately reported)  Note by: Gillis Santa, MD (TTS technology used. I apologize for any typographical errors that were not detected and corrected.) Date: 10/05/2022; Time: 11:56 AM

## 2022-10-05 NOTE — Progress Notes (Signed)
Safety precautions to be maintained throughout the outpatient stay will include: orient to surroundings, keep bed in low position, maintain call bell within reach at all times, provide assistance with transfer out of bed and ambulation.  

## 2022-10-12 ENCOUNTER — Telehealth: Payer: Self-pay | Admitting: Student in an Organized Health Care Education/Training Program

## 2022-10-12 NOTE — Telephone Encounter (Signed)
Returned patient phone call re; pregabalin and GI upset.  Instructed patient not to take the medication and she can discuss other options with BL at her appt on 10/25/22.  Patient verbalizes u/o information.

## 2022-10-12 NOTE — Telephone Encounter (Signed)
PT stated that the Pregabalin is making her stomach upset, PT stated that she is eating before she takes the medications. PT stated that when she took Pregabalin years ago it had her feeling the same way. Please give patient a call. TY

## 2022-10-24 ENCOUNTER — Telehealth: Payer: Medicare HMO | Admitting: Neurosurgery

## 2022-10-25 ENCOUNTER — Ambulatory Visit
Payer: Medicare HMO | Attending: Student in an Organized Health Care Education/Training Program | Admitting: Student in an Organized Health Care Education/Training Program

## 2022-10-25 ENCOUNTER — Ambulatory Visit
Admission: RE | Admit: 2022-10-25 | Discharge: 2022-10-25 | Disposition: A | Discharge status: Home / Self Care | Payer: Medicare HMO | Source: Ambulatory Visit | Attending: Student in an Organized Health Care Education/Training Program | Admitting: Student in an Organized Health Care Education/Training Program

## 2022-10-25 ENCOUNTER — Encounter: Payer: Self-pay | Admitting: Student in an Organized Health Care Education/Training Program

## 2022-10-25 DIAGNOSIS — M5416 Radiculopathy, lumbar region: Secondary | ICD-10-CM

## 2022-10-25 DIAGNOSIS — M48062 Spinal stenosis, lumbar region with neurogenic claudication: Secondary | ICD-10-CM | POA: Diagnosis present

## 2022-10-25 DIAGNOSIS — G8929 Other chronic pain: Secondary | ICD-10-CM

## 2022-10-25 MED ORDER — IOHEXOL 180 MG/ML  SOLN
INTRAMUSCULAR | Status: AC
  Filled 2022-10-25: qty 20

## 2022-10-25 MED ORDER — SODIUM CHLORIDE 0.9% FLUSH
2.0000 mL | Freq: Once | INTRAVENOUS | Status: AC
  Administered 2022-10-25: 2 mL

## 2022-10-25 MED ORDER — SODIUM CHLORIDE (PF) 0.9 % IJ SOLN
INTRAMUSCULAR | Status: AC
  Filled 2022-10-25: qty 10

## 2022-10-25 MED ORDER — LIDOCAINE HCL 2 % IJ SOLN
20.0000 mL | Freq: Once | INTRAMUSCULAR | Status: AC
  Administered 2022-10-25: 200 mg

## 2022-10-25 MED ORDER — DEXAMETHASONE SODIUM PHOSPHATE 10 MG/ML IJ SOLN
INTRAMUSCULAR | Status: AC
  Filled 2022-10-25: qty 1

## 2022-10-25 MED ORDER — ROPIVACAINE HCL 2 MG/ML IJ SOLN: 2.0000 mL | Freq: Once | INTRAMUSCULAR | Status: DC

## 2022-10-25 MED ORDER — LIDOCAINE HCL (PF) 2 % IJ SOLN
INTRAMUSCULAR | Status: AC
  Filled 2022-10-25: qty 10

## 2022-10-25 MED ORDER — MIDAZOLAM HCL 2 MG/2ML IJ SOLN
0.5000 mg | Freq: Once | INTRAMUSCULAR | Status: AC
  Administered 2022-10-25: 2 mg via INTRAVENOUS

## 2022-10-25 MED ORDER — LACTATED RINGERS IV SOLN: Freq: Once | INTRAVENOUS | Status: AC

## 2022-10-25 MED ORDER — MIDAZOLAM HCL 2 MG/2ML IJ SOLN
INTRAMUSCULAR | Status: AC
  Filled 2022-10-25: qty 2

## 2022-10-25 MED ORDER — DEXAMETHASONE SODIUM PHOSPHATE 10 MG/ML IJ SOLN
10.0000 mg | Freq: Once | INTRAMUSCULAR | Status: AC
  Administered 2022-10-25: 10 mg

## 2022-10-25 MED ORDER — ROPIVACAINE HCL 2 MG/ML IJ SOLN
INTRAMUSCULAR | Status: AC
  Filled 2022-10-25: qty 20

## 2022-10-25 MED ORDER — IOHEXOL 180 MG/ML  SOLN
10.0000 mL | Freq: Once | INTRAMUSCULAR | Status: AC
  Administered 2022-10-25: 10 mL via EPIDURAL

## 2022-10-25 NOTE — Patient Instructions (Signed)

## 2022-10-25 NOTE — Progress Notes (Signed)
PROVIDER NOTE: Interpretation of information contained herein should be left to medically-trained personnel. Specific patient instructions are provided elsewhere under "Patient Instructions" section of medical record. This document was created in part using STT-dictation technology, any transcriptional errors that may result from this process are unintentional.  Patient: Melanie Reilly Type: Established DOB: 1956/06/08 MRN: [id number] PCP: Marya Landry, MD  Service: Procedure DOS: 10/25/2022 Setting: Ambulatory Location: Ambulatory outpatient facility Delivery: Face-to-face Provider: Edward Jolly, MD Specialty: Interventional Pain Management Specialty designation: 09 Location: Outpatient facility Ref. Prov.: Edward Jolly, MD       Interventional Therapy   Procedure: Lumbar epidural steroid injection (LESI) (interlaminar) #1    Laterality: Midline   Level:  L4-5 Level.  Imaging: Fluoroscopic guidance         Anesthesia: Local anesthesia (1-2% Lidocaine) Anxiolysis: IV Versed 2 mg  DOS: 10/25/2022  Performed by: Edward Jolly, MD  Purpose: Diagnostic/Therapeutic Indications: Lumbar radicular pain of intraspinal etiology of more than 4 weeks that has failed to respond to conservative therapy and is severe enough to impact quality of life or function. 1. Chronic radicular lumbar pain   2. Lumbar radiculopathy   3. Spinal stenosis, lumbar region, with neurogenic claudication    NAS-11 Pain score:   Pre-procedure: 7 /10   Post-procedure: 0-No pain/10      Position / Prep / Materials:  Position: Prone w/ head of the table raised (slight reverse trendelenburg) to facilitate breathing.  Prep solution: DuraPrep (Iodine Povacrylex [0.7% available iodine] and Isopropyl Alcohol, 74% w/w) Prep Area: Entire Posterior Lumbar Region from lower scapular tip down to mid buttocks area and from flank to flank. Materials:  Tray: Epidural tray Needle(s):  Type: Epidural needle (Tuohy) Gauge  (G):  22 Length: Regular (3.5-in) Qty: 1   Pre-op H&P Assessment:  Melanie Reilly is a 67 y.o. (year old), female patient, seen today for interventional treatment. 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 Melanie Reilly has a current medication list which includes the following prescription(s): albuterol sulfate, aspirin, bupropion, pataday, pregabalin, ropinirole, sertraline, tiotropium bromide monohydrate, tizanidine, tramadol, atorvastatin, d3-50, losartan-hydrochlorothiazide, and pantoprazole, and the following Facility-Administered Medications: lactated ringers and ropivacaine (pf) 2 mg/ml (0.2%). Her primarily concern today is the Back Pain (lower)  Initial Vital Signs:  Pulse/HCG Rate: 69ECG Heart Rate: 71 Temp: 98.2 F (36.8 C) Resp: (!) 26 BP: (!) 123/92 SpO2: 95 %  BMI: Estimated body mass index is 26.58 kg/m as calculated from the following:   Height as of this encounter:  (1.753 m).   Weight as of this encounter: 180 lb (81.6 kg).  Risk Assessment: Allergies: Reviewed. She has No Known Allergies.  Allergy Precautions: None required Coagulopathies: Reviewed. None identified.  Blood-thinner therapy: None at this time Active Infection(s): Reviewed. None identified. Melanie Reilly is afebrile  Site Confirmation: Melanie Reilly was asked to confirm the procedure and laterality before marking the site Procedure checklist: Completed Consent: Before the procedure and under the influence of no sedative(s), amnesic(s), or anxiolytics, the patient was informed of the treatment options, risks and possible complications. To fulfill our ethical and legal obligations, as recommended by the American Medical Association's Code of Ethics, I have informed the patient of my clinical impression;  the nature and purpose of the treatment or procedure; the risks, benefits, and possible complications of the intervention; the alternatives, including doing  nothing; the risk(s) and benefit(s) of the alternative treatment(s) or procedure(s); and the risk(s) and benefit(s) of doing nothing. The patient was provided information about the general risks and possible complications associated with the procedure. These may include, but are not limited to: failure to achieve desired goals, infection, bleeding, organ or nerve damage, allergic reactions, paralysis, and death. In addition, the patient was informed of those risks and complications associated to Spine-related procedures, such as failure to decrease pain; infection (i.e.: Meningitis, epidural or intraspinal abscess); bleeding (i.e.: epidural hematoma, subarachnoid hemorrhage, or any other type of intraspinal or peri-dural bleeding); organ or nerve damage (i.e.: Any type of peripheral nerve, nerve root, or spinal cord injury) with subsequent damage to sensory, motor, and/or autonomic systems, resulting in permanent pain, numbness, and/or weakness of one or several areas of the body; allergic reactions; (i.e.: anaphylactic reaction); and/or death. Furthermore, the patient was informed of those risks and complications associated with the medications. These include, but are not limited to: allergic reactions (i.e.: anaphylactic or anaphylactoid reaction(s)); adrenal axis suppression; blood sugar elevation that in diabetics may result in ketoacidosis or comma; water retention that in patients with history of congestive heart failure may result in shortness of breath, pulmonary edema, and decompensation with resultant heart failure; weight gain; swelling or edema; medication-induced neural toxicity; particulate matter embolism and blood vessel occlusion with resultant organ, and/or nervous system infarction; and/or aseptic necrosis of one or more joints. Finally,  the patient was informed that Medicine is not an exact science; therefore, there is also the possibility of unforeseen or unpredictable risks and/or possible complications that may result in a catastrophic outcome. The patient indicated having understood very clearly. We have given the patient no guarantees and we have made no promises. Enough time was given to the patient to ask questions, all of which were answered to the patient's satisfaction. Melanie Reilly has indicated that she wanted to continue with the procedure. Attestation: I, the ordering provider, attest that I have discussed with the patient the benefits, risks, side-effects, alternatives, likelihood of achieving goals, and potential problems during recovery for the procedure that I have provided informed consent. Date  Time: 10/25/2022  9:37 AM   Pre-Procedure Preparation:  Monitoring: As per clinic protocol. Respiration, ETCO2, SpO2, BP, heart rate and rhythm monitor placed and checked for adequate function Safety Precautions: Patient was assessed for positional comfort and pressure points before starting the procedure. Time-out: I initiated and conducted the "Time-out" before starting the procedure, as per protocol. The patient was asked to participate by confirming the accuracy of the "Time Out" information. Verification of the correct person, site, and procedure were performed and confirmed by me, the nursing staff, and the patient. "Time-out" conducted as per Joint Commission's Universal Protocol (UP.01.01.01). Time: 1016 Start Time: 1016 hrs.  Description/Narrative of Procedure:          Target: Epidural space via interlaminar opening, initially targeting the lower laminar border of the superior vertebral body. Region: Lumbar Approach: Percutaneous paravertebral  Rationale (medical necessity): procedure needed and proper for the diagnosis and/or treatment of the patient's medical symptoms and needs. Procedural Technique Safety  Precautions: Aspiration looking for blood return was conducted prior to all injections. At no point did we inject any substances, as a needle was being advanced. No attempts were made at seeking any paresthesias. Safe injection practices and needle disposal techniques used. Medications properly checked for expiration dates. SDV (single dose vial) medications used. Description of the Procedure: Protocol guidelines were followed.  The procedure needle was introduced through the skin, ipsilateral to the reported pain, and advanced to the target area. Bone was contacted and the needle walked caudad, until the lamina was cleared. The epidural space was identified using "loss-of-resistance technique" with 2-3 ml of PF-NaCl (0.9% NSS), in a 5cc LOR glass syringe.  6 cc solution made of 3 cc of preservative-free saline, 2 cc of 0.2% ropivacaine, 1 cc of Decadron 10 mg/cc.   Vitals:   10/25/22 1013 10/25/22 1018 10/25/22 1020 10/25/22 1026  BP: (!) 147/95 (!) 143/95 (!) 140/98 (!) 143/81  Pulse:      Resp: (!) 25 (!) 24 (!) 25 20  Temp:    (!) 97 F (36.1 C)  TempSrc:      SpO2: 91% 92% 92% 95%  Weight:      Height:        Start Time: 1016 hrs. End Time: 1020 hrs.  Imaging Guidance (Spinal):          Type of Imaging Technique: Fluoroscopy Guidance (Spinal) Indication(s): Assistance in needle guidance and placement for procedures requiring needle placement in or near specific anatomical locations not easily accessible without such assistance. Exposure Time: Please see nurses notes. Contrast: Before injecting any contrast, we confirmed that the patient did not have an allergy to iodine, shellfish, or radiological contrast. Once satisfactory needle placement was completed at the desired level, radiological contrast was injected. Contrast injected under live fluoroscopy. No contrast complications. See chart for type and volume of contrast used. Fluoroscopic Guidance: I was personally present during the  use of fluoroscopy. "Tunnel Vision Technique" used to obtain the best possible view of the target area. Parallax error corrected before commencing the procedure. "Direction-depth-direction" technique used to introduce the needle under continuous pulsed fluoroscopy. Once target was reached, antero-posterior, oblique, and lateral fluoroscopic projection used confirm needle placement in all planes. Images permanently stored in EMR. Interpretation: I personally interpreted the imaging intraoperatively. Adequate needle placement confirmed in multiple planes. Appropriate spread of contrast into desired area was observed. No evidence of afferent or efferent intravascular uptake. No intrathecal or subarachnoid spread observed. Permanent images saved into the patient's record.  Antibiotic Prophylaxis:   Anti-infectives (From admission, onward)    None      Indication(s): None identified  Post-operative Assessment:  Post-procedure Vital Signs:  Pulse/HCG Rate: 6964 Temp: (!) 97 F (36.1 C) Resp: 20 BP: (!) 143/81 SpO2: 95 %  EBL: None  Complications: No immediate post-treatment complications observed by team, or reported by patient.  Note: The patient tolerated the entire procedure well. A repeat set of vitals were taken after the procedure and the patient was kept under observation following institutional policy, for this type of procedure. Post-procedural neurological assessment was performed, showing return to baseline, prior to discharge. The patient was provided with post-procedure discharge instructions, including a section on how to identify potential problems. Should any problems arise concerning this procedure, the patient was given instructions to immediately contact us, at any time, without hesitation. In any case, we plan to contact the patient by telephone for a follow-up status report regarding this interventional procedure.  Comments:  No additional relevant information.  Plan of  Care (POC)  Orders:  Orders Placed This Encounter  Procedures   DG PAIN CLINIC C-ARM 1-60 MIN NO REPORT    Intraoperative interpretation by procedural physician at [facility].    Standing Status:   Standing    Number of Occurrences:   1  Order Specific Question:   Reason for exam:    Answer:   Assistance in needle guidance and placement for procedures requiring needle placement in or near specific anatomical locations not easily accessible without such assistance.     Medications ordered for procedure: Meds ordered this encounter  Medications   iohexol (OMNIPAQUE) 180 MG/ML injection 10 mL    Must be Myelogram-compatible. If not available, you may substitute with a water-soluble, non-ionic, hypoallergenic, myelogram-compatible radiological contrast medium.   lidocaine (XYLOCAINE) 2 % (with pres) injection 400 mg   lactated ringers infusion   midazolam (VERSED) injection 0.5-2 mg    Make sure Flumazenil is available in the pyxis when using this medication. If oversedation occurs, administer 0.2 mg IV over 15 sec. If after 45 sec no response, administer 0.2 mg again over 1 min; may repeat at 1 min intervals; not to exceed 4 doses (1 mg)   ropivacaine (PF) 2 mg/mL (0.2%) (NAROPIN) injection 2 mL   sodium chloride flush (NS) 0.9 % injection 2 mL   dexamethasone (DECADRON) injection 10 mg   Medications administered: We administered iohexol, lidocaine, lactated ringers, midazolam, sodium chloride flush, and dexamethasone.  See the medical record for exact dosing, route, and time of administration.  Follow-up plan:   Return in about 5 weeks (around 11/29/2022), or PPE F2F.       L4/5 ESI (very tight interspace) 10/25/22    Recent Visits Date Type Provider Dept  10/05/22 Office Visit Edward Jolly, MD [facility]  Showing recent visits within past 90 days and meeting all other requirements Today's Visits Date Type Provider Dept  10/25/22 Procedure visit  Edward Jolly, MD [facility]  Showing today's visits and meeting all other requirements Future Appointments Date Type Provider Dept  11/29/22 Appointment Edward Jolly, MD [facility]  Showing future appointments within next 90 days and meeting all other requirements  Disposition: Discharge home  Discharge (Date  Time): 10/25/2022; 1029 hrs.   Primary Care Physician: Doughton, Margit Banda, MD Location: [facility] Note by: Edward Jolly, MD (TTS technology used. I apologize for any typographical errors that were not detected and corrected.) Date: 10/25/2022; Time: 10:49 AM  Disclaimer:  Medicine is not an Visual merchandiser. The only guarantee in medicine is that nothing is guaranteed. It is important to note that the decision to proceed with this intervention was based on the information collected from the patient. The Data and conclusions were drawn from the patient's questionnaire, the interview, and the physical examination. Because the information was provided in large part by the patient, it cannot be guaranteed that it has not been purposely or unconsciously manipulated. Every effort has been made to obtain as much relevant data as possible for this evaluation. It is important to note that the conclusions that lead to this procedure are derived in large part from the available data. Always take into account that the treatment will also be dependent on availability of resources and existing treatment guidelines, considered by other Pain Management Practitioners as being common knowledge and practice, at the time of the intervention. For Medico-Legal purposes, it is also important to point out that variation in procedural techniques and pharmacological choices are the acceptable norm. The indications, contraindications, technique, and results of the above procedure should only be interpreted and judged by a Board-Certified Interventional Pain Specialist with  extensive familiarity and expertise in the same exact procedure and technique.

## 2022-10-26 ENCOUNTER — Telehealth: Payer: Self-pay | Admitting: Student in an Organized Health Care Education/Training Program

## 2022-10-26 ENCOUNTER — Telehealth: Payer: Self-pay | Admitting: *Deleted

## 2022-10-26 NOTE — Telephone Encounter (Signed)
Called for post procedure check. No answer. LVM. 

## 2022-10-26 NOTE — Telephone Encounter (Signed)
PT called back to let nurse know that last night she experience a lot of pain in her legs. PT stated that she doing fine today. Please give patient a call TY

## 2022-11-09 ENCOUNTER — Ambulatory Visit (INDEPENDENT_AMBULATORY_CARE_PROVIDER_SITE_OTHER): Payer: Self-pay | Admitting: Neurosurgery

## 2022-11-09 DIAGNOSIS — Z91199 Patient's noncompliance with other medical treatment and regimen due to unspecified reason: Secondary | ICD-10-CM

## 2022-11-09 NOTE — Progress Notes (Signed)
Two attempts were made to contact Melanie Reilly to complete her telephone visit.  Her phone went straight to voicemail.  I did leave a voicemail requesting a call back.  Should she call back, we can reschedule her telephone visit to discuss her response to recent injection with Dr. Cherylann Ratel

## 2022-11-29 ENCOUNTER — Ambulatory Visit: Payer: Medicare HMO | Admitting: Student in an Organized Health Care Education/Training Program

## 2023-04-30 ENCOUNTER — Encounter: Payer: Self-pay | Admitting: Orthopedic Surgery

## 2023-04-30 ENCOUNTER — Other Ambulatory Visit: Payer: Self-pay | Admitting: Orthopedic Surgery

## 2023-04-30 DIAGNOSIS — S76019A Strain of muscle, fascia and tendon of unspecified hip, initial encounter: Secondary | ICD-10-CM

## 2023-04-30 DIAGNOSIS — M1611 Unilateral primary osteoarthritis, right hip: Secondary | ICD-10-CM

## 2023-05-04 ENCOUNTER — Encounter: Payer: Self-pay | Admitting: Orthopedic Surgery

## 2023-05-14 ENCOUNTER — Ambulatory Visit: Admission: RE | Admit: 2023-05-14 | Payer: Medicare HMO | Source: Ambulatory Visit

## 2023-06-11 ENCOUNTER — Other Ambulatory Visit: Payer: Medicare HMO

## 2023-10-19 ENCOUNTER — Other Ambulatory Visit: Payer: Self-pay | Admitting: Family Medicine

## 2023-10-19 DIAGNOSIS — Z1231 Encounter for screening mammogram for malignant neoplasm of breast: Secondary | ICD-10-CM

## 2023-10-22 ENCOUNTER — Inpatient Hospital Stay
Admission: RE | Admit: 2023-10-22 | Discharge: 2023-10-22 | Disposition: A | Discharge status: Home / Self Care | Payer: Self-pay | Source: Ambulatory Visit | Attending: Student | Admitting: Student

## 2023-10-22 ENCOUNTER — Other Ambulatory Visit: Payer: Self-pay | Admitting: *Deleted

## 2023-10-22 DIAGNOSIS — Z1231 Encounter for screening mammogram for malignant neoplasm of breast: Secondary | ICD-10-CM

## 2023-10-23 ENCOUNTER — Other Ambulatory Visit: Payer: Self-pay | Admitting: Family Medicine

## 2023-10-23 DIAGNOSIS — N644 Mastodynia: Secondary | ICD-10-CM

## 2023-10-23 DIAGNOSIS — S2001XA Contusion of right breast, initial encounter: Secondary | ICD-10-CM

## 2023-10-23 DIAGNOSIS — Z1231 Encounter for screening mammogram for malignant neoplasm of breast: Secondary | ICD-10-CM

## 2023-10-31 ENCOUNTER — Ambulatory Visit
Admission: RE | Admit: 2023-10-31 | Discharge: 2023-10-31 | Disposition: A | Discharge status: Home / Self Care | Source: Ambulatory Visit | Attending: Family Medicine | Admitting: Family Medicine

## 2023-10-31 DIAGNOSIS — R92313 Mammographic fatty tissue density, bilateral breasts: Secondary | ICD-10-CM | POA: Diagnosis not present

## 2023-10-31 DIAGNOSIS — N6310 Unspecified lump in the right breast, unspecified quadrant: Secondary | ICD-10-CM | POA: Insufficient documentation

## 2023-10-31 DIAGNOSIS — N644 Mastodynia: Secondary | ICD-10-CM | POA: Diagnosis present

## 2023-10-31 DIAGNOSIS — S2001XA Contusion of right breast, initial encounter: Secondary | ICD-10-CM | POA: Diagnosis not present

## 2023-10-31 DIAGNOSIS — Z1231 Encounter for screening mammogram for malignant neoplasm of breast: Secondary | ICD-10-CM

## 2023-12-18 ENCOUNTER — Ambulatory Visit: Admitting: Podiatry

## 2024-05-14 NOTE — Progress Notes (Signed)
 Reason for Visit:     Post op Interval History:   History of Present Illness  Melanie Reilly is a 68 year old female who presents with concerns for infection following left index finger surgery.  She underwent a left index finger dorsal capsulotomy and extensor flexor tenolysis on October 20th. She missed her first post-operative appointment and now has swelling at the surgical site, although the incision remains sealed.  She has been unable to start physical therapy due to concerns about potential infection and the presence of stitches. Despite this, she has been attempting to bend her finger on her own. The swelling has not completely hindered her progress, as she feels she is 'still better than we started even with this swelling going on.'  She experiences significant pain at the surgical site, describing it as 'hurts like heck.' She has been using Neosporin  ointment on the incision and requests additional pain medication. No allergies are reported, and she is unsure about her current blood pressure medication, possibly lisinopril.  In terms of social history, she has quit smoking, although her son smokes, which may cause a residual smell on her. She is using the [employer] system to communicate with the clinic and has attempted to send pictures of the incision, although she notes that the images do not accurately represent the condition.       Past Medical History: Past Medical History:  Diagnosis Date  . COPD (chronic obstructive pulmonary disease) (CMS/HHS-HCC)   . Depression   . Hypertension   . Restless legs   . Skin cancer    Past Surgical History:  Procedure Laterality Date  . KNEE ARTHROSCOPY Left 2015  . OPEN REDUCTION METACARPAL FRACTURE Left 05/31/2023   Procedure: OPEN TREATMENT OF METACARPAL FRACTURE, SINGLE, INCLUDES INTERNAL FIXATION, WHEN PERFORMED, EACH BONE;  Surgeon: Charlie Oliva Pac, MD;  Location: [facility];  Service: Orthopedics;  Laterality: Left;   . DEBRIDEMENT CHEST  05/31/2023   Procedure: ADULT, DEBRIDEMENT, CHEST, INCLUDING REMOVAL OF FOREIGN MATERIAL AT THE SITE OF AN OPEN FRACTURE AND/OR AN OPEN DISLOCATION; SKIN, SUBCUTANEOUS TISUUE, MUCLE FASCIA, MUSCLE, AND BONE;  Surgeon: Charlie Oliva Pac, MD;  Location: [facility];  Service: Orthopedics;;  . INTRAOPERATIVE FLUOROSCOPY  05/31/2023   Procedure: FLUOROSCOPY (SEPARATE PROCEDURE), UP TO 1 HOUR PHYSICIAN OR OTHER QUALIFIED HEALTH CARE PROFESSIONAL TIME;  Surgeon: Charlie Oliva Pac, MD;  Location: [facility];  Service: Orthopedics;;  . CAPSULOTOMY/CAPSULECTOMY METACARPOPHALANGEAL JOINT Left 04/28/2024   Procedure: LEFT CAPSULECTOMY OR CAPSULOTOMY; METACARPOPHALANGEAL JOINT, EACH JOINT;  Surgeon: Ona Calleen Hilt, MD;  Location: [facility];  Service: Plastic Surgery;  Laterality: Left;  . TENOLYSIS FLEXOR HAND/FINGER Left 04/28/2024   Procedure: LEFT TENOLYSIS, FLEXOR TENDON; PALM OR FINGER, EACH TENDON;  Surgeon: Ona Calleen Hilt, MD;  Location: [facility];  Service: Plastic Surgery;  Laterality: Left;  . ARTHROSCOPIC ROTATOR CUFF REPAIR    . bunionectomy Right    bone spur removed at same time  . CHOLECYSTECTOMY    . L Total Hip Arthroplasty 08/31/15     Dr Kathlynn  . Skin Cancer Excision     Forehead   Family History  Problem Relation Age of Onset  . Lung cancer Mother   . Prostate cancer Father    Current Outpatient Medications  Medication Sig Dispense Refill  . albuterol  90 mcg/actuation inhaler Inhale 2 inhalations into the lungs every 6 (six) hours as needed for Wheezing. 1 Inhaler 2  . BREZTRI AEROSPHERE 160-9-4.8 mcg/actuation inhaler Inhale 2  inhalations into the lungs 2 (two) times daily Pt reports just received this    . clindamycin (CLEOCIN) 150 MG capsule Take 1 capsule (150 mg total) by mouth 3 (three) times daily for 10 days 30 capsule 0  . losartan-hydroCHLOROthiazide  (HYZAAR) 50-12.5 mg tablet Take 1 tablet by mouth once daily     . magnesium  oxide (MAG-OX) 400 mg (241.3 mg magnesium ) tablet Take 400 mg by mouth once daily    . mv-min/iron/folic/calcium/vitK (WOMEN'S MULTIVITAMIN ORAL) Take 1 tablet by mouth once daily    . nicotine  (NICODERM CQ ) 14 mg/24 hr patch Place 1 patch onto the skin daily (Patient not taking: Reported on 07/23/2023)    . pantoprazole (PROTONIX) 40 MG DR tablet Take 40 mg by mouth 2 (two) times daily (Patient not taking: Reported on 04/24/2024)    . polyethylene glycol (MIRALAX) packet Take 1 packet (17 g total) by mouth once daily as needed for Constipation Mix in 4-8ounces of fluid prior to taking. (Patient not taking: Reported on 07/23/2023)    . potassium chloride (KLOR-CON M10) 10 mEq ER tablet Take 10 mEq by mouth 2 (two) times daily    . rOPINIRole  (REQUIP ) 1 MG tablet TAKE ONE TABLET BY MOUTH NIGHTLY 30 tablet 0  . rOPINIRole  (REQUIP ) 3 MG immediate release tablet Take 3 mg by mouth at bedtime    . sennosides-docusate (SENOKOT-S) 8.6-50 mg tablet Take 2 tablets by mouth 2 (two) times daily (Patient not taking: Reported on 10/22/2023)    . sennosides-docusate (SENOKOT-S) 8.6-50 mg tablet Take 1 tablet by mouth once daily 30 tablet 0  . sertraline  (ZOLOFT ) 100 MG tablet Take 1 tablet (100 mg total) by mouth once daily. 30 tablet 5  . tiotropium bromide  (SPIRIVA  RESPIMAT) 2.5 mcg/actuation inhalation spray Inhale 2 inhalations (5 mcg total) into the lungs once daily for 30 days (Patient not taking: Reported on 04/24/2024) 4 g 2  . traMADoL  (ULTRAM ) 50 mg tablet Take 1 tablet (50 mg total) by mouth every 6 (six) hours as needed for Pain for up to 5 days 21 tablet 0  . varenicline tartrate (CHANTIX) 1 mg tablet Take 1 tablet (1 mg total) by mouth 2 (two) times daily Take 1 tablet once daily with breakfast for 7 days, then increase to 1 tablet twice daily with breakfast and dinner for 11 weeks. 161 tablet 1   No current facility-administered medications for this visit.   No Known Allergies  Review  of Systems: A complete ROS was performed with pertinent positives and negatives as documented. All other systems negative.  Physical Exam: General/Constitutional: No apparent distress: well-nourished and well developed. Eyes: Pupils equal, round with synchronous movement. Respiratory: Non-labored breathing Neurological:  Oriented to person, place, and time. Psychological:  Normal mood and affect. Musculoskeletal: Normal, except as noted in detailed exam and in HPI.  Focused Physical Examination:  There were no vitals taken for this visit.  Physical Exam  MUSCULOSKELETAL: Left index finger with swelling, tenderness, and calloused skin. SKIN: Incisions holding together with some skin breakdown.        Assessment and Plan:    ICD-10-CM  1. Open displaced fracture of shaft of second metacarpal bone of left hand with routine healing, subsequent encounter  S62.321D    Assessment & Plan  Postoperative infection and cellulitis of left index finger after capsulotomy and tenolysis Concerns for infection post-surgery with swelling and tenderness. Incisions sealed but skin breakdown present. - Removed all stitches. - Prescribed oral antibiotics. - Apply Neosporin  ointment  to incisions. - Monitor redness and report improvement via [facility] My[facility] in 1-2 days.  Postoperative pain of left index finger after capsulotomy and tenolysis Significant pain likely due to stitches and swelling. - Refilled pain medication prescription.  Status post left index finger dorsal capsulotomy and extensor/flexor tenolysis, surgical aftercare Therapy not initiated due to infection concerns. She has been attempting self-exercises. - Encouraged finger movement exercises. - Scheduled follow-up appointment next week with Melissa.     Diagnoses and all orders for this visit:  Open displaced fracture of shaft of second metacarpal bone of left hand with routine healing, subsequent encounter  Other orders -      clindamycin (CLEOCIN) 150 MG capsule; Take 1 capsule (150 mg total) by mouth 3 (three) times daily for 10 days -     traMADoL  (ULTRAM ) 50 mg tablet; Take 1 tablet (50 mg total) by mouth every 6 (six) hours as needed for Pain for up to 5 days      I personally saw and evaluated the patient, and participated in the management and treatment plan as documented in the resident/fellow note.  Calleen Fredric Parkinson, MD    This note has been created using automated tools and reviewed for accuracy by Calleen Fredric Parkinson.

## 2024-06-17 ENCOUNTER — Encounter: Payer: Self-pay | Admitting: Podiatry

## 2024-06-17 ENCOUNTER — Other Ambulatory Visit: Payer: Self-pay | Admitting: Podiatry

## 2024-06-17 NOTE — Discharge Instructions (Signed)

## 2024-06-18 ENCOUNTER — Ambulatory Visit: Payer: Self-pay | Admitting: Anesthesiology

## 2024-06-18 ENCOUNTER — Encounter: Payer: Self-pay | Admitting: Podiatry

## 2024-06-18 ENCOUNTER — Other Ambulatory Visit: Payer: Self-pay

## 2024-06-18 ENCOUNTER — Encounter: Admission: RE | Disposition: A | Payer: Self-pay | Attending: Podiatry

## 2024-06-18 ENCOUNTER — Ambulatory Visit
Admission: RE | Admit: 2024-06-18 | Discharge: 2024-06-18 | Disposition: A | Discharge status: Home / Self Care | Attending: Podiatry | Admitting: Podiatry

## 2024-06-18 DIAGNOSIS — I10 Essential (primary) hypertension: Secondary | ICD-10-CM | POA: Insufficient documentation

## 2024-06-18 DIAGNOSIS — M1990 Unspecified osteoarthritis, unspecified site: Secondary | ICD-10-CM | POA: Insufficient documentation

## 2024-06-18 DIAGNOSIS — M67471 Ganglion, right ankle and foot: Secondary | ICD-10-CM | POA: Diagnosis present

## 2024-06-18 DIAGNOSIS — F32A Depression, unspecified: Secondary | ICD-10-CM | POA: Insufficient documentation

## 2024-06-18 DIAGNOSIS — J449 Chronic obstructive pulmonary disease, unspecified: Secondary | ICD-10-CM | POA: Diagnosis not present

## 2024-06-18 DIAGNOSIS — F17210 Nicotine dependence, cigarettes, uncomplicated: Secondary | ICD-10-CM | POA: Diagnosis not present

## 2024-06-18 HISTORY — DX: Dyspnea, unspecified: R06.00

## 2024-06-18 HISTORY — PX: GANGLION CYST EXCISION: SHX1691

## 2024-06-18 SURGERY — EXCISION, GANGLION CYST, FOOT
Anesthesia: General | Site: Foot | Laterality: Right

## 2024-06-18 MED ORDER — FENTANYL CITRATE (PF) 50 MCG/ML IJ SOSY: 25.0000 ug | PREFILLED_SYRINGE | INTRAMUSCULAR | Status: DC | PRN

## 2024-06-18 MED ORDER — ONDANSETRON HCL 4 MG/2ML IJ SOLN: 4.0000 mg | Freq: Once | INTRAMUSCULAR | Status: DC | PRN

## 2024-06-18 MED ORDER — OXYCODONE HCL 5 MG/5ML PO SOLN: 5.0000 mg | Freq: Once | ORAL | Status: DC | PRN

## 2024-06-18 MED ORDER — FENTANYL CITRATE (PF) 100 MCG/2ML IJ SOLN
INTRAMUSCULAR | Status: AC
  Filled 2024-06-18: qty 2

## 2024-06-18 MED ORDER — MIDAZOLAM HCL (PF) 2 MG/2ML IJ SOLN
INTRAMUSCULAR | Status: DC | PRN
  Administered 2024-06-18: 2 mg via INTRAVENOUS

## 2024-06-18 MED ORDER — LACTATED RINGERS IV SOLN: INTRAVENOUS | Status: DC

## 2024-06-18 MED ORDER — ONDANSETRON HCL 4 MG/2ML IJ SOLN: 4.0000 mg | Freq: Four times a day (QID) | INTRAMUSCULAR | Status: DC | PRN

## 2024-06-18 MED ORDER — LIDOCAINE HCL (PF) 2 % IJ SOLN
INTRAMUSCULAR | Status: AC
  Filled 2024-06-18: qty 5

## 2024-06-18 MED ORDER — PROPOFOL 500 MG/50ML IV EMUL
INTRAVENOUS | Status: DC | PRN
  Administered 2024-06-18: 125 ug/kg/min via INTRAVENOUS

## 2024-06-18 MED ORDER — PROPOFOL 1000 MG/100ML IV EMUL
INTRAVENOUS | Status: AC
  Filled 2024-06-18: qty 100

## 2024-06-18 MED ORDER — ACETAMINOPHEN 10 MG/ML IV SOLN: 1000.0000 mg | Freq: Once | INTRAVENOUS | Status: DC | PRN

## 2024-06-18 MED ORDER — MIDAZOLAM HCL 2 MG/2ML IJ SOLN
INTRAMUSCULAR | Status: AC
  Filled 2024-06-18: qty 2

## 2024-06-18 MED ORDER — FENTANYL CITRATE (PF) 100 MCG/2ML IJ SOLN
INTRAMUSCULAR | Status: DC | PRN
  Administered 2024-06-18 (×2): 25 ug via INTRAVENOUS

## 2024-06-18 MED ORDER — METOCLOPRAMIDE HCL 5 MG/ML IJ SOLN: 5.0000 mg | Freq: Three times a day (TID) | INTRAMUSCULAR | Status: DC | PRN

## 2024-06-18 MED ORDER — CEFAZOLIN SODIUM-DEXTROSE 2-4 GM/100ML-% IV SOLN
2.0000 g | INTRAVENOUS | Status: AC
  Administered 2024-06-18: 2 g via INTRAVENOUS

## 2024-06-18 MED ORDER — PROPOFOL 10 MG/ML IV BOLUS
INTRAVENOUS | Status: AC
  Filled 2024-06-18: qty 20

## 2024-06-18 MED ORDER — OXYCODONE-ACETAMINOPHEN 5-325 MG PO TABS: 1.0000 | ORAL_TABLET | ORAL | 0 refills | Status: AC | PRN

## 2024-06-18 MED ORDER — OXYCODONE HCL 5 MG PO TABS: 5.0000 mg | ORAL_TABLET | Freq: Once | ORAL | Status: DC | PRN

## 2024-06-18 MED ORDER — PROPOFOL 10 MG/ML IV BOLUS
INTRAVENOUS | Status: DC | PRN
  Administered 2024-06-18: 30 mg via INTRAVENOUS

## 2024-06-18 MED ORDER — ONDANSETRON HCL 4 MG PO TABS: 4.0000 mg | ORAL_TABLET | Freq: Four times a day (QID) | ORAL | Status: DC | PRN

## 2024-06-18 MED ORDER — LIDOCAINE HCL (CARDIAC) PF 100 MG/5ML IV SOSY
PREFILLED_SYRINGE | INTRAVENOUS | Status: DC | PRN
  Administered 2024-06-18: 50 mg via INTRAVENOUS

## 2024-06-18 MED ORDER — CEFAZOLIN SODIUM-DEXTROSE 2-3 GM-%(50ML) IV SOLR
INTRAVENOUS | Status: AC
  Filled 2024-06-18: qty 50

## 2024-06-18 MED ORDER — GLYCOPYRROLATE 0.2 MG/ML IJ SOLN
INTRAMUSCULAR | Status: DC | PRN
  Administered 2024-06-18: .1 mg via INTRAVENOUS

## 2024-06-18 MED ORDER — LIDOCAINE-EPINEPHRINE 1 %-1:100000 IJ SOLN
INTRAMUSCULAR | Status: DC | PRN
  Administered 2024-06-18: 2.5 mL

## 2024-06-18 MED ORDER — OXYCODONE-ACETAMINOPHEN 5-325 MG PO TABS: 1.0000 | ORAL_TABLET | Freq: Three times a day (TID) | ORAL | 0 refills | Status: AC | PRN

## 2024-06-18 MED ORDER — BUPIVACAINE HCL (PF) 0.5 % IJ SOLN
INTRAMUSCULAR | Status: DC | PRN
  Administered 2024-06-18: 2.5 mL

## 2024-06-18 MED ORDER — METOCLOPRAMIDE HCL 5 MG PO TABS: 5.0000 mg | ORAL_TABLET | Freq: Three times a day (TID) | ORAL | Status: DC | PRN

## 2024-06-18 SURGICAL SUPPLY — 28 items
BNDG COHESIVE 4X5 TAN STRL LF (GAUZE/BANDAGES/DRESSINGS) ×1 IMPLANT
BNDG GAUZE DERMACEA FLUFF 4 (GAUZE/BANDAGES/DRESSINGS) ×1 IMPLANT
BNDG STRETCH 4X75 STRL LF (GAUZE/BANDAGES/DRESSINGS) ×1 IMPLANT
CANISTER SUCT 1200ML W/VALVE (MISCELLANEOUS) ×1 IMPLANT
CUFF TOURN SGL QUICK 18X4 (TOURNIQUET CUFF) IMPLANT
DURAPREP 26ML APPLICATOR (WOUND CARE) ×1 IMPLANT
ELECTRODE REM PT RTRN 9FT ADLT (ELECTROSURGICAL) ×1 IMPLANT
GAUZE SPONGE 4X4 12PLY STRL (GAUZE/BANDAGES/DRESSINGS) ×1 IMPLANT
GAUZE XEROFORM 1X8 LF (GAUZE/BANDAGES/DRESSINGS) ×1 IMPLANT
GLOVE BIOGEL PI IND STRL 8 (GLOVE) ×1 IMPLANT
GLOVE SURG SS PI 7.5 STRL IVOR (GLOVE) ×1 IMPLANT
GOWN SPEC L4 XLG W/TWL (GOWN DISPOSABLE) ×1 IMPLANT
GOWN STRL REUS W/ TWL LRG LVL3 (GOWN DISPOSABLE) ×1 IMPLANT
KIT TURNOVER KIT A (KITS) ×1 IMPLANT
NS IRRIG 500ML POUR BTL (IV SOLUTION) ×1 IMPLANT
PACK EXTREMITY ARMC (MISCELLANEOUS) ×1 IMPLANT
PENCIL SMOKE EVACUATOR (MISCELLANEOUS) ×1 IMPLANT
STOCKINETTE IMPERVIOUS LG (DRAPES) ×1 IMPLANT
SUT ETHILON 5-0 FS-2 18 BLK (SUTURE) IMPLANT
SUT MNCRL 5-0+ PC-1 (SUTURE) IMPLANT
SUT VIC AB 0 CT1 36 (SUTURE) IMPLANT
SUT VIC AB 2-0 SH 27XBRD (SUTURE) IMPLANT
SUT VIC AB 3-0 SH 27X BRD (SUTURE) IMPLANT
SUT VIC AB 4-0 FS2 27 (SUTURE) IMPLANT
SUT VICRYL AB 3-0 FS1 BRD 27IN (SUTURE) IMPLANT
SUTURE EHLN 3-0 FS-10 30 BLK (SUTURE) IMPLANT
SUTURE ETHLN 4-0 FS2 18XMF BLK (SUTURE) IMPLANT
SUTURE MNCRL 4-0 27XMF (SUTURE) IMPLANT

## 2024-06-18 NOTE — Anesthesia Postprocedure Evaluation (Signed)
 Anesthesia Post Note  Patient: Melanie Reilly  Procedure(s) Performed: EXCISION, GANGLION CYST, FOOT (Right: Foot)  Patient location during evaluation: PACU Anesthesia Type: General Level of consciousness: awake and alert, oriented and patient cooperative Pain management: pain level controlled Vital Signs Assessment: post-procedure vital signs reviewed and stable Respiratory status: spontaneous breathing, nonlabored ventilation and respiratory function stable Cardiovascular status: blood pressure returned to baseline and stable Postop Assessment: adequate PO intake Anesthetic complications: no   No notable events documented.   Last Vitals:  Vitals:   06/18/24 1400 06/18/24 1415  BP: 101/68 118/72  Pulse: 74 68  Resp: (!) 23 (!) 22  Temp:  (!) 36.3 C  SpO2: 93% 93%    Last Pain:  Vitals:   06/18/24 1415  TempSrc:   PainSc: 0-No pain                 Alfonso Ruths

## 2024-06-18 NOTE — Op Note (Signed)
 Operative note   Surgeon:Camil Wilhelmsen [employer]/support/administrative Officer: None    Preop diagnosis: Cyst dorsal lateral right foot    Postop diagnosis: Same    Procedure: Excision cyst dorsal lateral right foot    EBL: Minimal    Anesthesia: Local with IV sedation.  Local consist of a total of 5 cc of a one-to-one mixture of 0.5% plain bupivacaine  and 1% lidocaine  with epinephrine .  A local field block was performed.    Hemostasis: Lidocaine  with epinephrine     Specimen: Cystic lesion dorsal lateral right foot    Complications: None    Operative indications:Melanie Reilly is an 68 y.o. that presents today for surgical intervention.  The risks/benefits/alternatives/complications have been discussed and consent has been given.    Procedure:  Patient was brought into the OR and placed on the operating table in thesupine position. After anesthesia was obtained theright lower extremity was prepped and draped in usual sterile fashion.  Attention was directed to the dorsal lateral midfoot of the right foot at about the level of the fifth met cuboid region.  Initial incision was performed.  Blunt dissection carried down through the superficial skin and the cystic mass was noted.  The mass did extend deep into the subcutaneous tissue overlying the peroneus tertius tendon as well as deep extension towards the joint.  The mass was excised in toto.  The wound was flushed with copious amounts of irrigation.  Bleeders were Bovie cauterized as appropriate.  Closure was then performed with a 3-0 Vicryl the deeper layers and a 3-0 nylon for the skin.  A bulky sterile dressing was applied to the right foot.    Patient tolerated the procedure and anesthesia well.  Was transported from the OR to the PACU with all vital signs stable and vascular status intact. To be discharged per routine protocol.  Will follow up in approximately 1 week in the outpatient clinic.

## 2024-06-18 NOTE — H&P (Signed)
 HISTORY AND PHYSICAL INTERVAL NOTE:  06/18/2024  12:44 PM  Melanie Reilly  has presented today for surgery, with the diagnosis of Ganglion cyst of foot M67.479 Right foot pain M79.671.  The various methods of treatment have been discussed with the patient.  No guarantees were given.  After consideration of risks, benefits and other options for treatment, the patient has consented to surgery.  I have reviewed the patients chart and labs.     A history and physical examination was performed in my office.  The patient was reexamined.  There have been no changes to this history and physical examination.  Ashley Soulier A

## 2024-06-18 NOTE — Transfer of Care (Signed)
 Immediate Anesthesia Transfer of Care Note  Patient: Melanie Reilly  Procedure(s) Performed: EXCISION, GANGLION CYST, FOOT (Right: Foot)  Patient Location: PACU  Anesthesia Type: General  Level of Consciousness: awake, alert  and patient cooperative  Airway and Oxygen Therapy: Patient Spontanous Breathing and Patient connected to supplemental oxygen  Post-op Assessment: Post-op Vital signs reviewed, Patient's Cardiovascular Status Stable, Respiratory Function Stable, Patent Airway and No signs of Nausea or vomiting  Post-op Vital Signs: Reviewed and stable  Complications: No notable events documented.

## 2024-06-18 NOTE — Anesthesia Preprocedure Evaluation (Addendum)
 Anesthesia Evaluation  Patient identified by MRN, date of birth, ID band Patient awake    Reviewed: Allergy & Precautions, NPO status , Patient's Chart, lab work & pertinent test results  History of Anesthesia Complications Negative for: history of anesthetic complications  Airway Mallampati: II   Neck ROM: Full    Dental  (+) Upper Dentures, Lower Dentures   Pulmonary COPD, Current Smoker (1/3 ppd)Patient did not abstain from smoking.   Pulmonary exam normal breath sounds clear to auscultation       Cardiovascular hypertension, Normal cardiovascular exam Rhythm:Regular Rate:Normal     Neuro/Psych  PSYCHIATRIC DISORDERS  Depression    negative neurological ROS     GI/Hepatic negative GI ROS,,,  Endo/Other  negative endocrine ROS    Renal/GU negative Renal ROS     Musculoskeletal  (+) Arthritis ,    Abdominal   Peds  Hematology negative hematology ROS (+)   Anesthesia Other Findings   Reproductive/Obstetrics                              Anesthesia Physical Anesthesia Plan  ASA: 2  Anesthesia Plan: General   Post-op Pain Management:    Induction: Intravenous  PONV Risk Score and Plan: 2 and Propofol  infusion, TIVA and Treatment may vary due to age or medical condition  Airway Management Planned: Natural Airway  Additional Equipment:   Intra-op Plan:   Post-operative Plan:   Informed Consent: I have reviewed the patients History and Physical, chart, labs and discussed the procedure including the risks, benefits and alternatives for the proposed anesthesia with the patient or authorized representative who has indicated his/her understanding and acceptance.       Plan Discussed with: CRNA  Anesthesia Plan Comments: (LMA/GETA backup discussed.  Patient consented for risks of anesthesia including but not limited to:  - adverse reactions to medications - damage to eyes,  teeth, lips or other oral mucosa - nerve damage due to positioning  - sore throat or hoarseness - damage to heart, brain, nerves, lungs, other parts of body or loss of life  Informed patient about role of CRNA in peri- and intra-operative care.  Patient voiced understanding.)         Anesthesia Quick Evaluation

## 2024-06-20 LAB — SURGICAL PATHOLOGY

## 2024-07-19 NOTE — Progress Notes (Unsigned)
 Sleep Medicine   Office Visit  Patient Name: Melanie Reilly DOB: [birth year] MRN [id number]    Chief Complaint: ***  Brief History:  Melanie Reilly presents for an initial consult for sleep evaluation with a reported history of ***. She reports her sleep quality is ***. This is noted *** nights. The patient's bed partner reports  *** at night. The patient relates the following symptoms: *** are also present. The patient goes to sleep at *** and wakes up at ***.  Sleep quality is *** when outside home environment.  Patient has noted *** of her legs at night.  The patient  relates *** behavior during the night.  The patient *** a history of psychiatric problems. The Epworth Sleepiness Score is *** out of 24 .  The patient relates  Cardiovascular risk factors include: *** The patient reports ***    ROS  General: (-) fever, (-) chills, (-) night sweat Nose and Sinuses: (-) nasal stuffiness or itchiness, (-) postnasal drip, (-) nosebleeds, (-) sinus trouble. Mouth and Throat: (-) sore throat, (-) hoarseness. Neck: (-) swollen glands, (-) enlarged thyroid, (-) neck pain. Respiratory: *** cough, *** shortness of breath, *** wheezing. Neurologic: *** numbness, *** tingling. Psychiatric: *** anxiety, *** depression Sleep behavior: ***sleep paralysis ***hypnogogic hallucinations ***dream enactment      ***vivid dreams ***cataplexy ***night terrors ***sleep walking   Current Medication: Outpatient Encounter Medications as of 07/21/2024  Medication Sig   Albuterol  Sulfate 108 (90 Base) MCG/ACT AEPB Inhale 1 puff into the lungs every 4 (four) hours as needed (wheezing, shortness of breath).   Ascorbic Acid (VITAMIN C) 500 MG CAPS Take 500 mg by mouth daily.   D3-50 1.25 MG (50000 UT) capsule Take 50,000 Units by mouth once a week.   losartan-hydrochlorothiazide  (HYZAAR) 50-12.5 MG tablet Take 1 tablet by mouth daily.   oxyCODONE -acetaminophen  (PERCOCET) 5-325 MG tablet Take 1 tablet by mouth every 4  (four) hours as needed for severe pain (pain score 7-10).   oxyCODONE -acetaminophen  (PERCOCET) 5-325 MG tablet Take 1-2 tablets by mouth every 8 (eight) hours as needed for severe pain (pain score 7-10). Max 6 tabs per day   oxyCODONE -acetaminophen  (PERCOCET) 5-325 MG tablet Take 1-2 tablets by mouth every 8 (eight) hours as needed for severe pain (pain score 7-10). Max 6 tabs per day   PATADAY 0.2 % SOLN Apply 1 drop to eye daily. (Patient taking differently: Apply 1 drop to eye as needed.)   rOPINIRole  (REQUIP ) 1 MG tablet Take 1 mg by mouth 3 (three) times daily as needed.   sertraline  (ZOLOFT ) 100 MG tablet Take 100 mg by mouth at bedtime. 1.5 tablets at bedtime   Tiotropium Bromide  Monohydrate (SPIRIVA  HANDIHALER IN) Inhale 1 puff into the lungs daily as needed. (Patient taking differently: Inhale 1 puff into the lungs as needed.)   No facility-administered encounter medications on file as of 07/21/2024.    Surgical History: Past Surgical History:  Procedure Laterality Date   ABDOMINAL HYSTERECTOMY  07/10/2008   CHOLECYSTECTOMY     DIAGNOSTIC LAPAROSCOPY     GANGLION CYST EXCISION Right 06/18/2024   Procedure: EXCISION, GANGLION CYST, FOOT;  Surgeon: Ashley Soulier, DPM;  Location: [facility];  Service: Orthopedics/Podiatry;  Laterality: Right;   HAND TENDON SURGERY Left    KNEE ARTHROSCOPY Left 07/11/2003   SHOULDER OPEN ROTATOR CUFF REPAIR Left 07/10/2010   TONSILLECTOMY     TOTAL HIP ARTHROPLASTY Left 08/31/2015   Procedure: TOTAL HIP ARTHROPLASTY ANTERIOR APPROACH;  Surgeon: Ozell Flake, MD;  Location: [facility];  Service: Orthopedics;  Laterality: Left;   WRIST FRACTURE SURGERY Left     Medical History: Past Medical History:  Diagnosis Date   Arthritis    Cancer (HCC) 07/10/2014   skin cancer on forehead   Complication of anesthesia    my COPD, I had difficulty breathing when I woke up.   COPD (chronic obstructive pulmonary disease) (HCC)    Depression     Dyspnea    Hypertension     Family History: Non contributory to the present illness  Social History: Social History   Socioeconomic History   Marital status: Single    Spouse name: Not on file   Number of children: Not on file   Years of education: Not on file   Highest education level: Not on file  Occupational History   Not on file  Tobacco Use   Smoking status: Every Day    Current packs/day: 0.25    Types: Cigarettes   Smokeless tobacco: Never  Vaping Use   Vaping status: Former  Substance and Sexual Activity   Alcohol use: No   Drug use: Never   Sexual activity: Not on file  Other Topics Concern   Not on file  Social History Narrative   Not on file   Social Drivers of Health   Tobacco Use: High Risk (06/18/2024)   Patient History    Smoking Tobacco Use: Every Day    Smokeless Tobacco Use: Never    Passive Exposure: Not on file  Financial Resource Strain: Low Risk  (04/15/2024)   Received from [facility]   Overall Financial Resource Strain (CARDIA)    Difficulty of Paying Living Expenses: Not very hard  Food Insecurity: Food Insecurity Present (04/15/2024)   Received from [facility]   Epic    Within the past 12 months, you worried that your food would run out before you got the money to buy more.: Sometimes true    Within the past 12 months, the food you bought just didn't last and you didn't have money to get more.: Never true  Transportation Needs: Unmet Transportation Needs (04/15/2024)   Received from [facility] - Transportation    In the past 12 months, has lack of transportation kept you from medical appointments or from getting medications?: Yes    Lack of Transportation (Non-Medical): Yes  Physical Activity: Not on file  Stress: Not on file  Social Connections: Not on file  Intimate Partner Violence: Not on file  Depression (PHQ2-9): Low Risk (10/05/2022)   Depression (PHQ2-9)     PHQ-2 Score: 0  Alcohol Screen: Not on file  Housing: Low Risk  (04/15/2024)   Received from [facility]   Epic    In the last 12 months, was there a time when you were not able to pay the mortgage or rent on time?: No    In the past 12 months, how many times have you moved where you were living?: 0    At any time in the past 12 months, were you homeless or living in a shelter (including now)?: No  Utilities: Not At Risk (04/15/2024)   Received from [facility]   Epic    In the past 12 months has the electric, gas, oil, or water company threatened to shut off services in your home?: No  Health Literacy: Not on file    Vital Signs: There were no  vitals taken for this visit. There is no height or weight on file to calculate BMI.   Examination: General Appearance: The patient is well-developed, well-nourished, and in no distress. Neck Circumference: *** Skin: Gross inspection of skin unremarkable. Head: normocephalic, no gross deformities. Eyes: no gross deformities noted. ENT: ears appear grossly normal Neurologic: Alert and oriented. No involuntary movements.    STOP BANG RISK ASSESSMENT S (snore) Have you been told that you snore?     YES/NO   T (tired) Are you often tired, fatigued, or sleepy during the day?   YES/NO  O (obstruction) Do you stop breathing, choke, or gasp during sleep? YES/NO   P (pressure) Do you have or are you being treated for high blood pressure? YES/NO   B (BMI) Is your body index greater than 35 kg/m? YES/NO   A (age) Are you 61 years old or older? YES   N (neck) Do you have a neck circumference greater than 16 inches?   YES/NO   G (gender) Are you a female? NO   TOTAL STOP/BANG YES ANSWERS                                                                A STOP-Bang score of 2 or less is considered low risk, and a score of 5 or more is high risk for having either moderate or severe OSA. For people who score 3  or 4, doctors may need to perform further assessment to determine how likely they are to have OSA.         EPWORTH SLEEPINESS SCALE:  Scale:  (0)= no chance of dozing; (1)= slight chance of dozing; (2)= moderate chance of dozing; (3)= high chance of dozing  Chance  Situtation    Sitting and reading: ***    Watching TV: ***    Sitting Inactive in public: ***    As a passenger in car: ***      Lying down to rest: ***    Sitting and talking: ***    Sitting quielty after lunch: ***    In a car, stopped in traffic: ***   TOTAL SCORE:   *** out of 24    SLEEP STUDIES:  No studies on file.   LABS: Recent Results (from the past 2160 hours)  Surgical pathology     Status: None   Collection Time: 06/18/24 12:00 AM  Result Value Ref Range   SURGICAL PATHOLOGY      SURGICAL PATHOLOGY [facility] [address] [place], [place] [address] Telephone [phone] or [phone] Fax [phone]  REPORT OF SURGICAL PATHOLOGY   Accession #: [id number] Patient Name: AMALA, PETION Visit # : [id number]  MRN: [id number] Physician: Ashley Soulier DOB/Age [birth year] (Age: 89) Gender: F Collected Date: 06/18/2024 Received Date: 06/18/2024  FINAL DIAGNOSIS       1. Ganglion cyst, Right foot :       -  FINDINGS CONSISTENT WITH GANGLION CYST.       DATE SIGNED OUT: 06/20/2024 ELECTRONIC SIGNATURE : Legolvan Do, Mark, Pathologist, Electronic Signature  MICROSCOPIC DESCRIPTION  CASE COMMENTS STAINS USED IN DIAGNOSIS: H&E    CLINICAL HISTORY  SPECIMEN(S) OBTAINED 1. Ganglion cyst, Right Foot  SPECIMEN COMMENTS: SPECIMEN CLINICAL INFORMATION:  1. Ganglion cyst of foot    Gross Description 1. Received in formalin is a 1.6 x 1.2 x 0.7 cm portion of soft tan red tissue. There is a 0.7  cm cyst containing cloudy mucus.  Sections are submitted in one cassette.   (GRP:kh 06/19/24)        Report signed out from the  following location(s) [place] [address] [place], [place] [address] CLIA #: [id number]  [facility] [address] [place], [place] [address] CLIA #: [id number]     Radiology: No results found.  No results found.  No results found.    Assessment and Plan: Patient Active Problem List   Diagnosis Date Noted   Lumbar radiculopathy 10/05/2022   Lumbar facet arthropathy 10/05/2022   Spinal stenosis, lumbar region, with neurogenic claudication 10/05/2022   Lumbar degenerative disc disease 10/05/2022   Chronic pain syndrome 10/05/2022   Primary osteoarthritis of left hip 08/31/2015     PLAN OSA:   Patient evaluation suggests high risk of sleep disordered breathing due to *** Patient has comorbid cardiovascular risk factors including: *** which could be exacerbated by pathologic sleep-disordered breathing.  Suggest: *** to assess/treat the patient's sleep disordered breathing. The patient was also counselled on *** to optimize sleep health.  PLAN hypersomnia:  Patient evaluation suggests significant daytime hypersomnia.  The Epworth Sleepiness Score is elevated at *** out of 24. Patient *** drowsy driving. The patient *** MVA due to sleepiness.  The patient *** restless leg symptoms which exacerbate *** for *** nights per week. The patient *** periodic limb movements which exacerbate ***  for *** nights per week. Suggest: ***  Also suggest ***  PLAN insomnia:  Patient evaluation suggests *** insomnia. This is a chronic disorder. This has been a concern for *** and causes impaired daytime functioning. The patient exhibits comorbid ***  The history *** suggest the insomnia predates the use of hypnotic medications. The symptoms *** with the discontinuation of these medications. There is no obvious medical, psychiatric or pharmacologic abuse issues ot account for the insomnia.  Treatment recommendations include: *** The patient should  maintain a sleep log and calculate total sleep time for 1-2 weeks. Set bed and wake times for achieve 85% sleep efficiency for one week. Once this is achieved  time in bed can be gradually increased. A pharmacologic treatment approach would include a trial of *** for the next ***  months. During this time the patient is to maintain a sleep diary to track progress.    ***  General Counseling: I have discussed the findings of the evaluation and examination with Heron.  I have also discussed any further diagnostic evaluation thatmay be needed or ordered today. Dniyah verbalizes understanding of the findings of todays visit. We also reviewed her medications today and discussed drug interactions and side effects including but not limited excessive drowsiness and altered mental states. We also discussed that there is always a risk not just to her but also people around her. she has been encouraged to call the office with any questions or concerns that should arise related to todays visit.  No orders of the defined types were placed in this encounter.       I have personally obtained a history, evaluated the patient, evaluated pertinent data, formulated the assessment and plan and placed orders.    Elfreda DELENA Bathe, MD [facility] Diplomate ABMS Pulmonary and Critical Care Medicine Sleep medicine

## 2024-07-21 ENCOUNTER — Ambulatory Visit: Payer: Self-pay

## 2024-08-18 ENCOUNTER — Ambulatory Visit
